# Patient Record
Sex: Male | Born: 1995 | Race: White | Hispanic: No | Marital: Single
Health system: Southern US, Community
[De-identification: ages and names within clinical notes are randomized; demographics above are authoritative.]

## PROBLEM LIST (undated history)

## (undated) DIAGNOSIS — F1910 Other psychoactive substance abuse, uncomplicated: Secondary | ICD-10-CM

---

## 2014-04-05 ENCOUNTER — Inpatient Hospital Stay (HOSPITAL_COMMUNITY)
Admission: EM | Admit: 2014-04-05 | Discharge: 2014-04-08 | DRG: 917 | Disposition: A | Discharge status: Other Acute Inpatient Hospital | Payer: BC Managed Care – PPO | Attending: Internal Medicine | Admitting: Internal Medicine

## 2014-04-05 ENCOUNTER — Observation Stay (HOSPITAL_COMMUNITY): Payer: BC Managed Care – PPO

## 2014-04-05 ENCOUNTER — Encounter (HOSPITAL_COMMUNITY): Payer: Self-pay | Admitting: Emergency Medicine

## 2014-04-05 DIAGNOSIS — F32A Depression, unspecified: Secondary | ICD-10-CM | POA: Diagnosis present

## 2014-04-05 DIAGNOSIS — J690 Pneumonitis due to inhalation of food and vomit: Secondary | ICD-10-CM

## 2014-04-05 DIAGNOSIS — E860 Dehydration: Secondary | ICD-10-CM | POA: Diagnosis present

## 2014-04-05 DIAGNOSIS — F1210 Cannabis abuse, uncomplicated: Secondary | ICD-10-CM | POA: Diagnosis present

## 2014-04-05 DIAGNOSIS — T424X4A Poisoning by benzodiazepines, undetermined, initial encounter: Principal | ICD-10-CM | POA: Diagnosis present

## 2014-04-05 DIAGNOSIS — T50901A Poisoning by unspecified drugs, medicaments and biological substances, accidental (unintentional), initial encounter: Secondary | ICD-10-CM

## 2014-04-05 DIAGNOSIS — Z818 Family history of other mental and behavioral disorders: Secondary | ICD-10-CM

## 2014-04-05 DIAGNOSIS — Z6836 Body mass index (BMI) 36.0-36.9, adult: Secondary | ICD-10-CM

## 2014-04-05 DIAGNOSIS — R000 Tachycardia, unspecified: Secondary | ICD-10-CM | POA: Diagnosis present

## 2014-04-05 DIAGNOSIS — G9340 Encephalopathy, unspecified: Secondary | ICD-10-CM

## 2014-04-05 DIAGNOSIS — F332 Major depressive disorder, recurrent severe without psychotic features: Secondary | ICD-10-CM | POA: Diagnosis present

## 2014-04-05 DIAGNOSIS — F1920 Other psychoactive substance dependence, uncomplicated: Secondary | ICD-10-CM | POA: Diagnosis present

## 2014-04-05 DIAGNOSIS — T438X2A Poisoning by other psychotropic drugs, intentional self-harm, initial encounter: Secondary | ICD-10-CM | POA: Diagnosis present

## 2014-04-05 DIAGNOSIS — E669 Obesity, unspecified: Secondary | ICD-10-CM | POA: Diagnosis present

## 2014-04-05 DIAGNOSIS — F17200 Nicotine dependence, unspecified, uncomplicated: Secondary | ICD-10-CM | POA: Diagnosis present

## 2014-04-05 DIAGNOSIS — M6282 Rhabdomyolysis: Secondary | ICD-10-CM

## 2014-04-05 DIAGNOSIS — D72829 Elevated white blood cell count, unspecified: Secondary | ICD-10-CM

## 2014-04-05 DIAGNOSIS — T43502A Poisoning by unspecified antipsychotics and neuroleptics, intentional self-harm, initial encounter: Secondary | ICD-10-CM | POA: Diagnosis present

## 2014-04-05 DIAGNOSIS — F329 Major depressive disorder, single episode, unspecified: Secondary | ICD-10-CM

## 2014-04-05 DIAGNOSIS — I498 Other specified cardiac arrhythmias: Secondary | ICD-10-CM

## 2014-04-05 DIAGNOSIS — F1910 Other psychoactive substance abuse, uncomplicated: Secondary | ICD-10-CM

## 2014-04-05 HISTORY — DX: Other psychoactive substance abuse, uncomplicated: F19.10

## 2014-04-05 LAB — CBC
HEMATOCRIT: 45.8 % (ref 39.0–52.0)
HEMOGLOBIN: 15.9 g/dL (ref 13.0–17.0)
MCH: 28.6 pg (ref 26.0–34.0)
MCHC: 34.7 g/dL (ref 30.0–36.0)
MCV: 82.5 fL (ref 78.0–100.0)
Platelets: 365 10*3/uL (ref 150–400)
RBC: 5.55 MIL/uL (ref 4.22–5.81)
RDW: 13.3 % (ref 11.5–15.5)
WBC: 20.3 10*3/uL — AB (ref 4.0–10.5)

## 2014-04-05 LAB — URINALYSIS, ROUTINE W REFLEX MICROSCOPIC
Bilirubin Urine: NEGATIVE
GLUCOSE, UA: NEGATIVE mg/dL
HGB URINE DIPSTICK: NEGATIVE
KETONES UR: 15 mg/dL — AB
Leukocytes, UA: NEGATIVE
Nitrite: NEGATIVE
Protein, ur: NEGATIVE mg/dL
Specific Gravity, Urine: 1.023 (ref 1.005–1.030)
UROBILINOGEN UA: 0.2 mg/dL (ref 0.0–1.0)
pH: 6 (ref 5.0–8.0)

## 2014-04-05 LAB — COMPREHENSIVE METABOLIC PANEL
ALBUMIN: 4.6 g/dL (ref 3.5–5.2)
ALK PHOS: 145 U/L — AB (ref 39–117)
ALT: 25 U/L (ref 0–53)
AST: 46 U/L — ABNORMAL HIGH (ref 0–37)
BILIRUBIN TOTAL: 1 mg/dL (ref 0.3–1.2)
BUN: 15 mg/dL (ref 6–23)
CHLORIDE: 96 meq/L (ref 96–112)
CO2: 25 mEq/L (ref 19–32)
Calcium: 10.2 mg/dL (ref 8.4–10.5)
Creatinine, Ser: 0.87 mg/dL (ref 0.50–1.35)
GFR calc non Af Amer: >90 mL/min (ref >90)
GLUCOSE: 98 mg/dL (ref 70–99)
POTASSIUM: 4.4 meq/L (ref 3.7–5.3)
SODIUM: 138 meq/L (ref 137–147)
Total Protein: 8.5 g/dL — ABNORMAL HIGH (ref 6.0–8.3)

## 2014-04-05 LAB — CK: Total CK: 4079 U/L — ABNORMAL HIGH (ref 7–232)

## 2014-04-05 LAB — RAPID URINE DRUG SCREEN, HOSP PERFORMED
Amphetamines: NOT DETECTED
BENZODIAZEPINES: POSITIVE — AB
Barbiturates: NOT DETECTED
COCAINE: NOT DETECTED
Opiates: POSITIVE — AB
TETRAHYDROCANNABINOL: POSITIVE — AB

## 2014-04-05 LAB — SALICYLATE LEVEL: Salicylate Lvl: <2 mg/dL — ABNORMAL LOW (ref 2.8–20.0)

## 2014-04-05 LAB — AMMONIA

## 2014-04-05 LAB — ETHANOL: Alcohol, Ethyl (B): <11 mg/dL (ref 0–11)

## 2014-04-05 LAB — MAGNESIUM: MAGNESIUM: 1.8 mg/dL (ref 1.5–2.5)

## 2014-04-05 LAB — TSH: TSH: 1.12 u[IU]/mL (ref 0.350–4.500)

## 2014-04-05 LAB — ACETAMINOPHEN LEVEL: Acetaminophen (Tylenol), Serum: <15 ug/mL (ref 10–30)

## 2014-04-05 MED ORDER — SODIUM CHLORIDE 0.9 % IV BOLUS (SEPSIS)
1000.0000 mL | Freq: Once | INTRAVENOUS | Status: AC
  Administered 2014-04-05: 1000 mL via INTRAVENOUS

## 2014-04-05 MED ORDER — POLYETHYLENE GLYCOL 3350 17 G PO PACK
17.0000 g | PACK | Freq: Every day | ORAL | Status: DC | PRN
  Filled 2014-04-05: qty 1

## 2014-04-05 MED ORDER — ALBUTEROL SULFATE (2.5 MG/3ML) 0.083% IN NEBU: 2.5000 mg | INHALATION_SOLUTION | RESPIRATORY_TRACT | Status: DC | PRN

## 2014-04-05 MED ORDER — ONDANSETRON HCL 4 MG PO TABS: 4.0000 mg | ORAL_TABLET | Freq: Four times a day (QID) | ORAL | Status: DC | PRN

## 2014-04-05 MED ORDER — SORBITOL 70 % SOLN
30.0000 mL | Freq: Every day | Status: DC | PRN
  Filled 2014-04-05: qty 30

## 2014-04-05 MED ORDER — NICOTINE 14 MG/24HR TD PT24
14.0000 mg | MEDICATED_PATCH | Freq: Every day | TRANSDERMAL | Status: DC
  Administered 2014-04-05 – 2014-04-08 (×4): 14 mg via TRANSDERMAL
  Filled 2014-04-05 (×4): qty 1

## 2014-04-05 MED ORDER — ENOXAPARIN SODIUM 60 MG/0.6ML ~~LOC~~ SOLN
60.0000 mg | SUBCUTANEOUS | Status: DC
  Administered 2014-04-05 – 2014-04-07 (×3): 60 mg via SUBCUTANEOUS
  Filled 2014-04-05 (×4): qty 0.6

## 2014-04-05 MED ORDER — PANTOPRAZOLE SODIUM 40 MG PO TBEC
40.0000 mg | DELAYED_RELEASE_TABLET | Freq: Every day | ORAL | Status: DC
  Administered 2014-04-05 – 2014-04-08 (×4): 40 mg via ORAL
  Filled 2014-04-05 (×4): qty 1

## 2014-04-05 MED ORDER — HYDROCODONE-ACETAMINOPHEN 5-325 MG PO TABS
1.0000 | ORAL_TABLET | ORAL | Status: DC | PRN
  Administered 2014-04-05 – 2014-04-06 (×3): 2 via ORAL
  Filled 2014-04-05 (×3): qty 2

## 2014-04-05 MED ORDER — MAGNESIUM CITRATE PO SOLN: 1.0000 | Freq: Once | ORAL | Status: AC | PRN

## 2014-04-05 MED ORDER — SODIUM CHLORIDE 0.9 % IJ SOLN
3.0000 mL | Freq: Two times a day (BID) | INTRAMUSCULAR | Status: DC
  Administered 2014-04-06 (×2): 3 mL via INTRAVENOUS

## 2014-04-05 MED ORDER — SODIUM CHLORIDE 0.9 % IV SOLN
INTRAVENOUS | Status: DC
  Administered 2014-04-05: 22:00:00 via INTRAVENOUS

## 2014-04-05 MED ORDER — ONDANSETRON HCL 4 MG/2ML IJ SOLN: 4.0000 mg | Freq: Four times a day (QID) | INTRAMUSCULAR | Status: DC | PRN

## 2014-04-05 MED ORDER — ACETAMINOPHEN 325 MG PO TABS
650.0000 mg | ORAL_TABLET | Freq: Four times a day (QID) | ORAL | Status: DC | PRN
  Administered 2014-04-07 – 2014-04-08 (×2): 650 mg via ORAL
  Filled 2014-04-05 (×2): qty 2

## 2014-04-05 MED ORDER — ACETAMINOPHEN 650 MG RE SUPP: 650.0000 mg | Freq: Four times a day (QID) | RECTAL | Status: DC | PRN

## 2014-04-05 NOTE — ED Provider Notes (Signed)
CSN: [id number]     Arrival date & time 04/05/14  1550 History   First MD Initiated Contact with Patient 04/05/14 1553     Chief Complaint  Patient presents with  . Medical Clearance     (Consider location/radiation/quality/duration/timing/severity/associated sxs/prior Treatment) HPI  Patient to the ER by [employer] for IVC papers taken out by the patients father.   The patient reports that he has been unable to sleep recently and took 5 x 2mg  tablets of Xanax before bed. He says his dad saw him "all sleepy and I think that was the last straw" He denies doing it to kill or harm himself. He admits it was the most he has ever taken. HE reports some problems at work and that he smokes weed. He reports that in his earlier teen years he had some substance abuse issues as well as thoughts of harm but not anymore. His heart rate is in the 150's pt denies feeling abnormal but admits he is very frustrated right now. At the time he is cooperative and coherent. Denies auditory or visual hallucinations. Denies Si/HI  The father reports that he was mumbling and unintelligible. [employer] say he was "passed out beside a building".  He has a history of ETOH and drug abuse in the past 6 months and was recently fired from his job "for sexual harassment".  History reviewed. No pertinent past medical history. History reviewed. No pertinent past surgical history. History reviewed. No pertinent family history. History  Substance Use Topics  . Smoking status: Current Some Day Smoker  . Smokeless tobacco: Not on file  . Alcohol Use: Yes    Review of Systems   Review of Systems  Gen: no weight loss, fevers, chills, night sweats  Eyes: no discharge or drainage, no occular pain or visual changes  Nose: no epistaxis or rhinorrhea  Mouth: no dental pain, no sore throat  Neck: no neck pain  Lungs:No wheezing, coughing or hemoptysis CV: no chest pain, palpitations, dependent edema or orthopnea  Abd: no abdominal pain,  nausea, vomiting, diarrhea GU: no dysuria or gross hematuria  MSK:  No muscle weakness or pain Neuro: no headache, no focal neurologic deficits  +sleepy and intentional overdose Skin: no rash or wounds Psyche: no complaints    Allergies  Review of patient's allergies indicates no known allergies.  Home Medications   Prior to Admission medications   Not on File   BP 133/71  Pulse 117  Temp(Src) 98.9 F (37.2 C) (Oral)  Resp 39  SpO2 98% Physical Exam  Nursing note and vitals reviewed. Constitutional: He appears well-developed and well-nourished. No distress.  HENT:  Head: Normocephalic and atraumatic.  Eyes: Pupils are equal, round, and reactive to light.  Neck: Normal range of motion. Neck supple.  Cardiovascular: Regular rhythm.  Tachycardia present.   Pulmonary/Chest: Effort normal.  Abdominal: Soft.  Neurological: He is alert.  Skin: Skin is warm and dry.  Psychiatric: His mood appears anxious. He is not actively hallucinating. He exhibits a depressed mood. He expresses no homicidal and no suicidal ideation. He expresses no suicidal plans and no homicidal plans. He is attentive.    ED Course  Procedures (including critical care time) Labs Review Labs Reviewed  CBC - Abnormal; Notable for the following:    WBC 20.3 (*)    All other components within normal limits  COMPREHENSIVE METABOLIC PANEL - Abnormal; Notable for the following:    Total Protein 8.5 (*)    AST 46 (*)  Alkaline Phosphatase 145 (*)    All other components within normal limits  SALICYLATE LEVEL - Abnormal; Notable for the following:    Salicylate Lvl <2.0 (*)    All other components within normal limits  URINE RAPID DRUG SCREEN (HOSP PERFORMED) - Abnormal; Notable for the following:    Opiates POSITIVE (*)    Benzodiazepines POSITIVE (*)    Tetrahydrocannabinol POSITIVE (*)    All other components within normal limits  CK - Abnormal; Notable for the following:    Total CK 4079 (*)     All other components within normal limits  ACETAMINOPHEN LEVEL  ETHANOL  AMMONIA    Imaging Review No results found.   EKG Interpretation   Date/Time:  Friday April 05 2014 15:59:02 EDT Ventricular Rate:  153 PR Interval:  98 QRS Duration: 87 QT Interval:  266 QTC Calculation: 424 R Axis:   66 Text Interpretation:  Sinus tachycardia Multiple ventricular premature  complexes No prior Confirmed by Gwendolyn GrantWALDEN  MD, BLAIR ([id number]) on 04/05/2014  4:23:25 PM      MDM   Final diagnoses:  Rhabdomyolysis  Drug overdose  Polysubstance abuse    Patients labs are abnormal. The CK is 4079. The patient apparently was immobilized for a prolonged period of time fafter taking too many xanax and other substances (parents feel it is heroin).  Will need a medical admission. At this time his pulse is improving with fluids but he continues to be tachypnic. Will admit to medicine. Dr. Gwendolyn GrantWalden has seen patient as well and is aware of plan.  Inpatient, tele, [facility], [facility], [facility].    Dorthula Matasiffany G Svetlana Bagby, PA-C 04/05/14 1800

## 2014-04-05 NOTE — Progress Notes (Signed)
Utilization Review completed.  Shan Padgett RN CM  

## 2014-04-05 NOTE — ED Notes (Signed)
Bed: [facility] Expected date:  Expected time:  Means of arrival:  Comments: [employer] med clearance

## 2014-04-05 NOTE — H&P (Signed)
[facility] History and Physical  Antonio Sullivan ZOX:[id number] DOB: March 13, 1996 DOA: 04/05/2014  Referring physician: Dr. Gwendolyn Grant PCP: No primary provider on file.   Chief Complaint: Altered mental status/acute encephalopathy  HPI: Antonio Sullivan is a 18 y.o. male  With history of polysubstance abuse with use of heroin states last use was 2-3 weeks prior to admission, history of abuse to anxiolytics, tobacco history presents to the ED from home as IVC which was taken on by his parents after being found down on the kitchen floor. Patient's mother states he found his son passed out and down on the kitchen floor around 4:45 AM on the morning of admission with increased respiratory rate. Stated that once patient when he woke up his mother went downtown and took out IVC papers on the patient as she felt patient needed help with his polysubstance abuse. Patient was subsequently brought to the ED. Per ED physicians report the police and stated that patient was mumbling unintelligible words. Patient denies any suicidal ideation. Patient denies any homicidal ideation. Patient states he took 5   2 mg Xanax tablets in order to be able to get some sleep and did not have any sleep 2-3 days prior to admission. Patient does endorse a depressed mood which started after his grandfather passed away on 08/31/2013. Patient stated that his school grades suffered, he felt very hopeless. Patient also does endorse some agitation. Patient states he drinks about one 40 ounce per week. Patient denies any auditory or visual hallucinations. Patient was seen in the emergency room was noted to be tachycardic with heart rates in the 150s that responded to IV fluids. Blood pressure was stable. CBC done had a white count of 20.3 otherwise was within normal limits. EKG done showed a sinus tachycardia. Comprehensive metabolic profile done on phosphatase of 145 AST of 46 protein of 8.5 otherwise was within normal limits. Total CK was  4079. Salicylate level was less than 2. Tylenol level is less than 15. Urine drug screen which was done was positive for benzos and opiates and THC. Urinalysis was not done. Chest x-ray was not done. We were called to admit the patient for further evaluation and management.   Review of Systems: As per history of present illness otherwise negative. Constitutional:  No weight loss, night sweats, Fevers, chills, fatigue.  HEENT:  No headaches, Difficulty swallowing,Tooth/dental problems,Sore throat,  No sneezing, itching, ear ache, nasal congestion, post nasal drip,  Cardio-vascular:  No chest pain, Orthopnea, PND, swelling in lower extremities, anasarca, dizziness, palpitations  GI:  No heartburn, indigestion, abdominal pain, nausea, vomiting, diarrhea, change in bowel habits, loss of appetite  Resp:  No shortness of breath with exertion or at rest. No excess mucus, no productive cough, No non-productive cough, No coughing up of blood.No change in color of mucus.No wheezing.No chest wall deformity  Skin:  no rash or lesions.  GU:  no dysuria, change in color of urine, no urgency or frequency. No flank pain.  Musculoskeletal:  No joint pain or swelling. No decreased range of motion. No back pain.  Psych:  No change in mood or affect. No depression or anxiety. No memory loss.   Past Medical History  Diagnosis Date  . Polysubstance abuse 04/05/2014   History reviewed. No pertinent past surgical history. Social History:  reports that he has been smoking.  He does not have any smokeless tobacco history on file. He reports that he drinks alcohol. He reports that he uses illicit drugs (Marijuana).  No  Known Allergies  History reviewed. No pertinent family history.   Prior to Admission medications   Not on File   Physical Exam: Filed Vitals:   04/05/14 1730  BP: 147/88  Pulse: 116  Temp:   Resp: 40    BP 147/88  Pulse 116  Temp(Src) 98.9 F (37.2 C) (Oral)  Resp 40  SpO2  98%  General:  Appears calm and comfortable. Patient in no acute cardiopulmonary distress. Eyes: PERRLA, normal lids, irises & conjunctiva ENT: grossly normal hearing, lips & tongue Neck: no LAD, masses or thyromegaly Cardiovascular: Tachycardic, no m/r/g. No LE edema. Telemetry: Sinus tachycardia Respiratory: CTA bilaterally, no w/r/r. Normal respiratory effort. Abdomen: soft, ntnd, positive bowel sounds. No rebound, no guarding. Skin: no rash or induration seen on limited exam Musculoskeletal: grossly normal tone BUE/BLE Psychiatric: Patient with a depressed mood. Poor insight. Poor judgment.  Neurologic: Alert and oriented x3. Cranial nerves II through XII are grossly intact. No focal deficits.           Labs on Admission:  Basic Metabolic Panel:  Recent Labs Lab 04/05/14 1600  NA 138  K 4.4  CL 96  CO2 25  GLUCOSE 98  BUN 15  CREATININE 0.87  CALCIUM 10.2   Liver Function Tests:  Recent Labs Lab 04/05/14 1600  AST 46*  ALT 25  ALKPHOS 145*  BILITOT 1.0  PROT 8.5*  ALBUMIN 4.6   No results found for this basename: LIPASE, AMYLASE,  in the last 168 hours  Recent Labs Lab 04/05/14 1640  AMMONIA RESULTS UNAVAILABLE DUE TO INTERFERING SUBSTANCE   CBC:  Recent Labs Lab 04/05/14 1600  WBC 20.3*  HGB 15.9  HCT 45.8  MCV 82.5  PLT 365   Cardiac Enzymes:  Recent Labs Lab 04/05/14 1600  CKTOTAL 4079*    BNP (last 3 results) No results found for this basename: PROBNP,  in the last 8760 hours CBG: No results found for this basename: GLUCAP,  in the last 168 hours  Radiological Exams on Admission: No results found.  EKG: Independently reviewed. Sinus tachycardia  Assessment/Plan Principal Problem:   Acute encephalopathy Active Problems:   Rhabdomyolysis   Sinus tachycardia   Leukocytosis   Depression   Polysubstance abuse  #1 acute encephalopathy/?? Drug overdose Questionable etiology. Likely secondary to multiple Xanax tablets patient  took. Patient denies any suicidal ideation. Patient denies any homicidal ideation. Patient is currently alert and following commands but no neurological deficits. CBC does have a leukocytosis with a white count of 20.3. Will check a urinalysis. Check a chest x-ray. Patient does have a history of polysubstance abuse. Will consult with psychiatry for further evaluation of his polysubstance abuse with possible drug overdose.  #2 mild rhabdomyolysis Patient has a normal renal function. Elevated CK likely secondary to patient's increased body mass and also possibly laying on the floor. Patient denies any muscular pain. Will hydrate with IV fluids. We'll follow renal function. I will follow total CK levels.  #3 depression/polysubstance abuse Patient did state that he felt depressed after his grandfather died in August of 2014 with a sense of hopelessness at that time. Patient also with a history of polysubstance abuse including heroin and anxiolytics. Patient does state that there is a family history of depression as well. Patient currently denies any suicidal ideation. No homicidal ideation. IVC papers have been taken out per patient's family. Will monitor patient on telemetry floor. We'll consult with psychiatry for further evaluation and management.  #4 leukocytosis Likely reactive  leukocytosis. Patient with no respiratory symptoms. Will check a chest x-ray. Check a UA with cultures and sensitivities. Follow.  #5 sinus tachycardia Likely secondary to dehydration. Also possibly secondary to anxiety. Heart rate improved with hydration. Place on IV fluids. Follow.  #6 prophylaxis PPI for GI prophylaxis. Lovenox for DVT prophylaxis.  Code Status: Full Family Communication: Updated patient, father and grandmother at bedside. Disposition Plan: Admit to telemetry.  Time spent: 65 minutes  Rodolph Bonganiel V Thompson MD [facility] Pager [phone]

## 2014-04-05 NOTE — ED Notes (Signed)
Patient transported to X-ray 

## 2014-04-05 NOTE — ED Notes (Signed)
[facility] state that 'pt was in a coherent state this morning and was mumbling in an unintelligible manner'.  Hx of drug and ETOH abuse in last 6 months. Recently fired for a job. Pt denies there is a problem. At present pt alert and oriented x4. Reports marijuana use daily and used xanax last night. Denies ETOH. Denies pain. Reports this morning he slept in. Denies SI/HI.

## 2014-04-05 NOTE — ED Provider Notes (Signed)
Medical screening examination/treatment/procedure(s) were conducted as a shared visit with non-physician practitioner(s) and myself.  I personally evaluated the patient during the encounter.   EKG Interpretation   Date/Time:  Friday April 05 2014 15:59:02 EDT Ventricular Rate:  153 PR Interval:  98 QRS Duration: 87 QT Interval:  266 QTC Calculation: 424 R Axis:   66 Text Interpretation:  Sinus tachycardia Multiple ventricular premature  complexes No prior Confirmed by Gwendolyn GrantWALDEN  MD, Orine Goga ([id number]) on 04/05/2014  4:23:25 PM      Patient IVC'd by parents since he was found down, hx of drug use, SI, lying to parents - admitted to taking Xanax to help him sleep. No SI prior to coming, but states SI now that he was "dragged to the ER." Patient with tachycardia in the 150s on EKG. With recent Xanax use, doubt withdrawal. Since found down, concern for dehydration. CK elevated at 4079, c/w mild rhabdomyolysis. HR responding for fluids, improving to 120s on my initial exam, sinus tachycardia on the monitor. Admitted for observation by Dr. Janee Mornhompson.   Dagmar HaitWilliam Cj Beecher, MD 04/05/14 [phone]

## 2014-04-06 DIAGNOSIS — T50901A Poisoning by unspecified drugs, medicaments and biological substances, accidental (unintentional), initial encounter: Secondary | ICD-10-CM

## 2014-04-06 DIAGNOSIS — F1910 Other psychoactive substance abuse, uncomplicated: Secondary | ICD-10-CM

## 2014-04-06 DIAGNOSIS — J690 Pneumonitis due to inhalation of food and vomit: Secondary | ICD-10-CM | POA: Diagnosis present

## 2014-04-06 DIAGNOSIS — F332 Major depressive disorder, recurrent severe without psychotic features: Secondary | ICD-10-CM

## 2014-04-06 LAB — CBC WITH DIFFERENTIAL/PLATELET
BASOS PCT: 0 % (ref 0–1)
Basophils Absolute: 0 10*3/uL (ref 0.0–0.1)
Eosinophils Absolute: 0.1 10*3/uL (ref 0.0–0.7)
Eosinophils Relative: 1 % (ref 0–5)
HEMATOCRIT: 36.6 % — AB (ref 39.0–52.0)
HEMOGLOBIN: 13.3 g/dL (ref 13.0–17.0)
Lymphocytes Relative: 29 % (ref 12–46)
Lymphs Abs: 3.9 10*3/uL (ref 0.7–4.0)
MCH: 30.6 pg (ref 26.0–34.0)
MCHC: 36.3 g/dL — AB (ref 30.0–36.0)
MCV: 84.3 fL (ref 78.0–100.0)
Monocytes Absolute: 1.4 10*3/uL — ABNORMAL HIGH (ref 0.1–1.0)
Monocytes Relative: 10 % (ref 3–12)
NEUTROS ABS: 7.9 10*3/uL — AB (ref 1.7–7.7)
NEUTROS PCT: 59 % (ref 43–77)
PLATELETS: 234 10*3/uL (ref 150–400)
RBC: 4.34 MIL/uL (ref 4.22–5.81)
RDW: 13.6 % (ref 11.5–15.5)
WBC: 13.3 10*3/uL — ABNORMAL HIGH (ref 4.0–10.5)

## 2014-04-06 LAB — URINE CULTURE: Colony Count: 2000

## 2014-04-06 LAB — COMPREHENSIVE METABOLIC PANEL
ALBUMIN: 3.4 g/dL — AB (ref 3.5–5.2)
ALT: 20 U/L (ref 0–53)
AST: 51 U/L — AB (ref 0–37)
Alkaline Phosphatase: 106 U/L (ref 39–117)
BUN: 16 mg/dL (ref 6–23)
CALCIUM: 9.1 mg/dL (ref 8.4–10.5)
CO2: 24 mEq/L (ref 19–32)
CREATININE: 0.9 mg/dL (ref 0.50–1.35)
Chloride: 106 mEq/L (ref 96–112)
GFR calc Af Amer: >90 mL/min (ref >90)
GFR calc non Af Amer: >90 mL/min (ref >90)
Glucose, Bld: 96 mg/dL (ref 70–99)
Potassium: 3.9 mEq/L (ref 3.7–5.3)
Sodium: 141 mEq/L (ref 137–147)
TOTAL PROTEIN: 6.5 g/dL (ref 6.0–8.3)
Total Bilirubin: 0.5 mg/dL (ref 0.3–1.2)

## 2014-04-06 LAB — CK TOTAL AND CKMB (NOT AT ARMC)
CK TOTAL: 3397 U/L — AB (ref 7–232)
CK, MB: 11.6 ng/mL — AB (ref 0.3–4.0)
Relative Index: 0.3 (ref 0.0–2.5)

## 2014-04-06 MED ORDER — LEVOFLOXACIN 500 MG PO TABS
500.0000 mg | ORAL_TABLET | Freq: Every day | ORAL | Status: DC
  Administered 2014-04-06 – 2014-04-08 (×3): 500 mg via ORAL
  Filled 2014-04-06 (×3): qty 1

## 2014-04-06 MED ORDER — LEVOFLOXACIN 500 MG PO TABS: 500.0000 mg | ORAL_TABLET | Freq: Every day | ORAL | Status: DC

## 2014-04-06 MED ORDER — NICOTINE 14 MG/24HR TD PT24: 14.0000 mg | MEDICATED_PATCH | Freq: Every day | TRANSDERMAL | Status: DC

## 2014-04-06 NOTE — Discharge Summary (Signed)
Physician Discharge Summary  Antonio Sullivan ZOX:[id number] DOB: 1995-12-26 DOA: 04/05/2014  PCP: No primary provider on file.  Admit date: 04/05/2014 Discharge date: 04/06/2014  Time spent: 40 minutes  Recommendations for Outpatient Follow-up:  1. Drink plenty of fluids for mild rhabdomyalysis 2. Psychiatric treatment for Polysubstance abuse  Discharge Diagnoses:  Principal Problem:   Acute encephalopathy Active Problems:   Rhabdomyolysis   Sinus tachycardia   Leukocytosis   Depression   Polysubstance abuse   Aspiration pneumonia   Discharge Condition: stable  Diet recommendation:regular  Filed Weights   04/05/14 1905  Weight: 127.6 kg (281 lb 4.9 oz)    History of present illness:  Antonio Corral is a 18 y.o. male  With history of polysubstance abuse with use of heroin states last use was 2-3 weeks prior to admission, history of abuse to anxiolytics, tobacco history presents to the ED from home as IVC which was taken on by his parents after being found down on the kitchen floor.  Patient's mother states he found his son passed out and down on the kitchen floor around 4:45 AM on the morning of admission with increased respiratory rate. Stated that once patient when he woke up his mother went downtown and took out IVC papers on the patient as she felt patient needed help with his polysubstance abuse.  Patient was subsequently brought to the ED. Per ED physicians report the police and stated that patient was mumbling unintelligible words. Patient denies any suicidal ideation. Patient denies any homicidal ideation. Patient states he took 5 2 mg Xanax tablets in order to be able to get some sleep and did not have any sleep 2-3 days prior to admission. Patient does endorse a depressed mood which started after his grandfather passed away on 08/11/2013. Patient stated that his school grades suffered, he felt very hopeless. Patient also does endorse some agitation. Patient states he drinks  about one 40 ounce per week. Patient denies any auditory or visual hallucinations.  Patient was seen in the emergency room was noted to be tachycardic with heart rates in the 150s that responded to IV fluids. Blood pressure was stable. CBC done had a white count of 20.3 otherwise was within normal limits. EKG done showed a sinus tachycardia. Comprehensive metabolic profile done on phosphatase of 145 AST of 46 protein of 8.5 otherwise was within normal limits. Total CK was 4079  Hospital Course:  #1.Drug overdose  Ingested multipleXanax tablets  -denies any suicidal ideation/homicidal ideation.  -improved mentation  -Psych eval completed, inpatient PSychiatry recommended  #2 mild rhabdomyolysis  -Found down due to OD  -improving  -encourage plenty of fluids PO  #3 depression/polysubstance abuse  history of polysubstance abuse including heroin and anxiolytics  -denies any suicidal ideation. No homicidal ideation. IVC papers have been taken out per patient's family  -psych and CSW consult completed -Inpatient Psych recommended  #4 Aspiration pneumonia  -asymptomatic, noted on CXR -will treat with levaquin for 7 days  -WBC improving   #5 sinus tachycardia  Likely secondary to pneumonia, OD  -resolved    Consultations:  Psych  Discharge Exam: Filed Vitals:   04/06/14 1427  BP: 147/65  Pulse: 82  Temp: 97.5 F (36.4 C)  Resp: 20    General: AAOx3 Cardiovascular: S1S2/RRR Respiratory: CTAB  Discharge Instructions You were cared for by a hospitalist during your hospital stay. If you have any questions about your discharge medications or the care you received while you were in the hospital after you are  discharged, you can call the unit and asked to speak with the hospitalist on call if the hospitalist that took care of you is not available. Once you are discharged, your primary care physician will handle any further medical issues. Please note that NO REFILLS for any  discharge medications will be authorized once you are discharged, as it is imperative that you return to your primary care physician (or establish a relationship with a primary care physician if you do not have one) for your aftercare needs so that they can reassess your need for medications and monitor your lab values.     Medication List    Notice   You have not been prescribed any medications.     No Known Allergies    The results of significant diagnostics from this hospitalization (including imaging, microbiology, ancillary and laboratory) are listed below for reference.    Significant Diagnostic Studies: Dg Chest 2 View  04/05/2014   CLINICAL DATA:  Altered mental status.  Leukocytosis.  EXAM: CHEST  2 VIEW  COMPARISON:  None.  FINDINGS: There is right upper and right lower lobe airspace disease most consistent with pneumonia. The left lung is clear. Heart size is normal. No pneumothorax or pleural effusion.  IMPRESSION: Right upper and lower lobe airspace disease most consistent with pneumonia.   Electronically Signed   By: Drusilla Kannerhomas  Dalessio M.D.   On: 04/05/2014 18:54    Microbiology: No results found for this or any previous visit (from the past 240 hour(s)).   Labs: Basic Metabolic Panel:  Recent Labs Lab 04/05/14 1600 04/06/14 0430  NA 138 141  K 4.4 3.9  CL 96 106  CO2 25 24  GLUCOSE 98 96  BUN 15 16  CREATININE 0.87 0.90  CALCIUM 10.2 9.1  MG 1.8  --    Liver Function Tests:  Recent Labs Lab 04/05/14 1600 04/06/14 0430  AST 46* 51*  ALT 25 20  ALKPHOS 145* 106  BILITOT 1.0 0.5  PROT 8.5* 6.5  ALBUMIN 4.6 3.4*   No results found for this basename: LIPASE, AMYLASE,  in the last 168 hours  Recent Labs Lab 04/05/14 1640  AMMONIA RESULTS UNAVAILABLE DUE TO INTERFERING SUBSTANCE   CBC:  Recent Labs Lab 04/05/14 1600 04/06/14 0430  WBC 20.3* 13.3*  NEUTROABS  --  7.9*  HGB 15.9 13.3  HCT 45.8 36.6*  MCV 82.5 84.3  PLT 365 234   Cardiac  Enzymes:  Recent Labs Lab 04/05/14 1600 04/06/14 0430  CKTOTAL 4079* 3397*  CKMB  --  11.6*   BNP: BNP (last 3 results) No results found for this basename: PROBNP,  in the last 8760 hours CBG: No results found for this basename: GLUCAP,  in the last 168 hours     Signed:  Zannie CovePreetha Xiao Graul  [facility] 04/06/2014, 2:55 PM

## 2014-04-06 NOTE — Consult Note (Signed)
[facility] Face-to-Face Psychiatry Consult   Reason for Consult:  Polysubstance dependence, depression Referring Physician:  Dr Grandville Silos  Antonio Sullivan is an 18 y.o. male. Total Time spent with patient: 20 minutes  Assessment: AXIS I:  Major Depression, Recurrent severe and Substance Abuse AXIS II:  Deferred AXIS III:   Past Medical History  Diagnosis Date  . Polysubstance abuse 04/05/2014   AXIS IV:  housing problems, other psychosocial or environmental problems, problems related to social environment and problems with primary support group AXIS V:  31-40 impairment in reality testing  Plan:  Recommend psychiatric Inpatient admission when medically cleared.  Subjective:   Antonio Reigle is a 18 y.o. male patient admitted with polysubstance dependence.  HPI:  Patient seen chart reviewed.  Patient is a 18 year old Caucasian unemployed single man who was admitted on the medical floor because of change in his mental status.  Psychiatry consult was called because patient is using drugs and alcohol.  Patient told that he has been using drugs and alcohol to help his depression.  He admitted history of severe depression most of his life however it has been getting worse and past few months.  Patient has noticed increased depression since his grandfather died in 08-06-2013.  He endorsed poor sleep, anhedonia, feelings of hopelessness and worthlessness.  He admitted to strong suicidal thoughts but denies any plan.  He has poor self-esteem regarding this self-image, obesity.  He endorsed limited socialization.  He admitted to poor sleep and lack of motivation.  He admitted to using Xanax from the street, marijuana and also pain medication.  He also endorsed using cocaine in the past.  He endorses suicidal thoughts and plan few months ago to take overdose but he had no nerves to do it.  Patient endorsed that his parents are separated and currently he is living with his father and sometimes stepfather.  Patient  appears tearful during the conversation.  He admitted heavy use of drugs because he does not want to go through this pain of depression.  His family recently took out IVC paper.  Patient denies any hallucination or any paranoia but endorsed withdrawn and very tearful.   Past Psychiatric History: Past Medical History  Diagnosis Date  . Polysubstance abuse 04/05/2014    reports that he has been smoking.  He does not have any smokeless tobacco history on file. He reports that he drinks alcohol. He reports that he uses illicit drugs (Marijuana). History reviewed. No pertinent family history.   Living Arrangements: Parent;Other relatives   Abuse/Neglect [facility]) Physical Abuse: Denies Verbal Abuse: Denies Sexual Abuse: Denies Allergies:  No Known Allergies  ACT Assessment Complete:  No:   Past Psychiatric History: Patient denies any previous history of psychiatric inpatient treatment or any suicidal attempt but endorsed long history of depression.  He is using heavy heavy drugs.  Objective: Blood pressure 147/65, pulse 82, temperature 97.5 F (36.4 C), temperature source Oral, resp. rate 20, height _0  (1.88 m), weight 281 lb 4.9 oz (127.6 kg), SpO2 98.00%.Body mass index is 36.1 kg/(m^2). Results for orders placed during the hospital encounter of 04/05/14 (from the past 72 hour(s))  ACETAMINOPHEN LEVEL     Status: None   Collection Time    04/05/14  4:00 PM      Result Value Ref Range   Acetaminophen (Tylenol), Serum <15.0  10 - 30 ug/mL   Comment:            THERAPEUTIC CONCENTRATIONS VARY  SIGNIFICANTLY. A RANGE OF 10-30     ug/mL MAY BE AN EFFECTIVE     CONCENTRATION FOR MANY PATIENTS.     HOWEVER, SOME ARE BEST TREATED     AT CONCENTRATIONS OUTSIDE THIS     RANGE.     ACETAMINOPHEN CONCENTRATIONS     >150 ug/mL AT 4 HOURS AFTER     INGESTION AND >50 ug/mL AT 12     HOURS AFTER INGESTION ARE     OFTEN ASSOCIATED WITH TOXIC     REACTIONS.  CBC     Status: Abnormal    Collection Time    04/05/14  4:00 PM      Result Value Ref Range   WBC 20.3 (*) 4.0 - 10.5 K/uL   RBC 5.55  4.22 - 5.81 MIL/uL   Hemoglobin 15.9  13.0 - 17.0 g/dL   HCT 45.8  39.0 - 52.0 %   MCV 82.5  78.0 - 100.0 fL   MCH 28.6  26.0 - 34.0 pg   MCHC 34.7  30.0 - 36.0 g/dL   RDW 13.3  11.5 - 15.5 %   Platelets 365  150 - 400 K/uL  COMPREHENSIVE METABOLIC PANEL     Status: Abnormal   Collection Time    04/05/14  4:00 PM      Result Value Ref Range   Sodium 138  137 - 147 mEq/L   Potassium 4.4  3.7 - 5.3 mEq/L   Chloride 96  96 - 112 mEq/L   CO2 25  19 - 32 mEq/L   Glucose, Bld 98  70 - 99 mg/dL   BUN 15  6 - 23 mg/dL   Creatinine, Ser 0.87  0.50 - 1.35 mg/dL   Calcium 10.2  8.4 - 10.5 mg/dL   Total Protein 8.5 (*) 6.0 - 8.3 g/dL   Albumin 4.6  3.5 - 5.2 g/dL   AST 46 (*) 0 - 37 U/L   ALT 25  0 - 53 U/L   Alkaline Phosphatase 145 (*) 39 - 117 U/L   Total Bilirubin 1.0  0.3 - 1.2 mg/dL   GFR calc non Af Amer >90  >90 mL/min   GFR calc Af Amer >90  >90 mL/min   Comment: (NOTE)     The eGFR has been calculated using the CKD EPI equation.     This calculation has not been validated in all clinical situations.     eGFR's persistently <90 mL/min signify possible Chronic Kidney     Disease.  ETHANOL     Status: None   Collection Time    04/05/14  4:00 PM      Result Value Ref Range   Alcohol, Ethyl (B) <11  0 - 11 mg/dL   Comment:            LOWEST DETECTABLE LIMIT FOR     SERUM ALCOHOL IS 11 mg/dL     FOR MEDICAL PURPOSES ONLY  SALICYLATE LEVEL     Status: Abnormal   Collection Time    04/05/14  4:00 PM      Result Value Ref Range   Salicylate Lvl <3.2 (*) 2.8 - 20.0 mg/dL  CK     Status: Abnormal   Collection Time    04/05/14  4:00 PM      Result Value Ref Range   Total CK 4079 (*) 7 - 232 U/L  MAGNESIUM     Status: None   Collection Time    04/05/14  4:00 PM      Result Value Ref Range   Magnesium 1.8  1.5 - 2.5 mg/dL  URINE RAPID DRUG SCREEN (HOSP PERFORMED)      Status: Abnormal   Collection Time    04/05/14  4:23 PM      Result Value Ref Range   Opiates POSITIVE (*) NONE DETECTED   Cocaine NONE DETECTED  NONE DETECTED   Benzodiazepines POSITIVE (*) NONE DETECTED   Amphetamines NONE DETECTED  NONE DETECTED   Tetrahydrocannabinol POSITIVE (*) NONE DETECTED   Barbiturates NONE DETECTED  NONE DETECTED   Comment:            DRUG SCREEN FOR MEDICAL PURPOSES     ONLY.  IF CONFIRMATION IS NEEDED     FOR ANY PURPOSE, NOTIFY LAB     WITHIN 5 DAYS.                LOWEST DETECTABLE LIMITS     FOR URINE DRUG SCREEN     Drug Class       Cutoff (ng/mL)     Amphetamine      1000     Barbiturate      200     Benzodiazepine   981     Tricyclics       191     Opiates          300     Cocaine          300     THC              50  AMMONIA     Status: None   Collection Time    04/05/14  4:40 PM      Result Value Ref Range   Ammonia RESULTS UNAVAILABLE DUE TO INTERFERING SUBSTANCE  11 - 60 umol/L  TSH     Status: None   Collection Time    04/05/14  4:45 PM      Result Value Ref Range   TSH 1.120  0.350 - 4.500 uIU/mL   Comment: Please note change in reference range.     Performed at [place], [place] MICROSCOPIC     Status: Abnormal   Collection Time    04/05/14  6:52 PM      Result Value Ref Range   Color, Urine YELLOW  YELLOW   APPearance CLOUDY (*) CLEAR   Specific Gravity, Urine 1.023  1.005 - 1.030   pH 6.0  5.0 - 8.0   Glucose, UA NEGATIVE  NEGATIVE mg/dL   Hgb urine dipstick NEGATIVE  NEGATIVE   Bilirubin Urine NEGATIVE  NEGATIVE   Ketones, ur 15 (*) NEGATIVE mg/dL   Protein, ur NEGATIVE  NEGATIVE mg/dL   Urobilinogen, UA 0.2  0.0 - 1.0 mg/dL   Nitrite NEGATIVE  NEGATIVE   Leukocytes, UA NEGATIVE  NEGATIVE   Comment: MICROSCOPIC NOT DONE ON URINES WITH NEGATIVE PROTEIN, BLOOD, LEUKOCYTES, NITRITE, OR GLUCOSE <1000 mg/dL.  COMPREHENSIVE METABOLIC PANEL     Status: Abnormal   Collection Time     04/06/14  4:30 AM      Result Value Ref Range   Sodium 141  137 - 147 mEq/L   Potassium 3.9  3.7 - 5.3 mEq/L   Chloride 106  96 - 112 mEq/L   Comment: DELTA CHECK NOTED   CO2 24  19 - 32 mEq/L   Glucose, Bld 96  70 - 99 mg/dL  BUN 16  6 - 23 mg/dL   Creatinine, Ser 0.90  0.50 - 1.35 mg/dL   Calcium 9.1  8.4 - 10.5 mg/dL   Total Protein 6.5  6.0 - 8.3 g/dL   Albumin 3.4 (*) 3.5 - 5.2 g/dL   AST 51 (*) 0 - 37 U/L   ALT 20  0 - 53 U/L   Alkaline Phosphatase 106  39 - 117 U/L   Total Bilirubin 0.5  0.3 - 1.2 mg/dL   GFR calc non Af Amer >90  >90 mL/min   GFR calc Af Amer >90  >90 mL/min   Comment: (NOTE)     The eGFR has been calculated using the CKD EPI equation.     This calculation has not been validated in all clinical situations.     eGFR's persistently <90 mL/min signify possible Chronic Kidney     Disease.  CBC WITH DIFFERENTIAL     Status: Abnormal   Collection Time    04/06/14  4:30 AM      Result Value Ref Range   WBC 13.3 (*) 4.0 - 10.5 K/uL   RBC 4.34  4.22 - 5.81 MIL/uL   Hemoglobin 13.3  13.0 - 17.0 g/dL   HCT 36.6 (*) 39.0 - 52.0 %   MCV 84.3  78.0 - 100.0 fL   MCH 30.6  26.0 - 34.0 pg   MCHC 36.3 (*) 30.0 - 36.0 g/dL   RDW 13.6  11.5 - 15.5 %   Platelets 234  150 - 400 K/uL   Comment: DELTA CHECK NOTED     REPEATED TO VERIFY   Neutrophils Relative % 59  43 - 77 %   Neutro Abs 7.9 (*) 1.7 - 7.7 K/uL   Lymphocytes Relative 29  12 - 46 %   Lymphs Abs 3.9  0.7 - 4.0 K/uL   Monocytes Relative 10  3 - 12 %   Monocytes Absolute 1.4 (*) 0.1 - 1.0 K/uL   Eosinophils Relative 1  0 - 5 %   Eosinophils Absolute 0.1  0.0 - 0.7 K/uL   Basophils Relative 0  0 - 1 %   Basophils Absolute 0.0  0.0 - 0.1 K/uL  CK TOTAL AND CKMB     Status: Abnormal   Collection Time    04/06/14  4:30 AM      Result Value Ref Range   Total CK 3397 (*) 7 - 232 U/L   CK, MB 11.6 (*) 0.3 - 4.0 ng/mL   Comment: CRITICAL RESULT CALLED TO, READ BACK BY AND VERIFIED WITH:     W.WILLARD,RN  0102 04/06/14 M.CAMPBELL   Relative Index 0.3  0.0 - 2.5   Comment: Performed at [facility] are reviewed and are pertinent for UDS positive for benzos, marijuana and opiates.  Current Facility-Administered Medications  Medication Dose Route Frequency Provider Last Rate Last Dose  . acetaminophen (TYLENOL) tablet 650 mg  650 mg Oral Q6H PRN Eugenie Filler, MD       Or  . acetaminophen (TYLENOL) suppository 650 mg  650 mg Rectal Q6H PRN Eugenie Filler, MD      . albuterol (PROVENTIL) (2.5 MG/3ML) 0.083% nebulizer solution 2.5 mg  2.5 mg Nebulization Q2H PRN Eugenie Filler, MD      . enoxaparin (LOVENOX) injection 60 mg  60 mg Subcutaneous Q24H Eugenie Filler, MD   60 mg at 04/05/14 2139  . HYDROcodone-acetaminophen (NORCO/VICODIN) 5-325 MG per tablet 1-2  tablet  1-2 tablet Oral Q4H PRN Eugenie Filler, MD   2 tablet at 04/05/14 2030  . levofloxacin (LEVAQUIN) tablet 500 mg  500 mg Oral Daily Domenic Polite, MD   500 mg at 04/06/14 1049  . nicotine (NICODERM CQ - dosed in mg/24 hours) patch 14 mg  14 mg Transdermal Daily Ritta Slot, NP   14 mg at 04/06/14 1056  . ondansetron (ZOFRAN) tablet 4 mg  4 mg Oral Q6H PRN Eugenie Filler, MD       Or  . ondansetron [facility]) injection 4 mg  4 mg Intravenous Q6H PRN Eugenie Filler, MD      . pantoprazole (PROTONIX) EC tablet 40 mg  40 mg Oral Daily Eugenie Filler, MD   40 mg at 04/06/14 1049  . polyethylene glycol (MIRALAX / GLYCOLAX) packet 17 g  17 g Oral Daily PRN Eugenie Filler, MD      . sodium chloride 0.9 % injection 3 mL  3 mL Intravenous Q12H Eugenie Filler, MD   3 mL at 04/06/14 1057  . sorbitol 70 % solution 30 mL  30 mL Oral Daily PRN Eugenie Filler, MD        Psychiatric Specialty Exam:     Blood pressure 147/65, pulse 82, temperature 97.5 F (36.4 C), temperature source Oral, resp. rate 20, height _0  (1.88 m), weight 281 lb 4.9 oz (127.6 kg), SpO2 98.00%.Body mass index is 36.1  kg/(m^2).  General Appearance: Tearful  Eye Contact::  Poor  Speech:  Slow  Volume:  Decreased  Mood:  Anxious, Depressed, Dysphoric and Hopeless  Affect:  Constricted and Depressed  Thought Process:  Logical  Orientation:  Full (Time, Place, and Person)  Thought Content:  Rumination  Suicidal Thoughts:  Yes.  without intent/plan  Homicidal Thoughts:  No  Memory:  Immediate;   Fair Recent;   Fair Remote;   Fair  Judgement:  Intact  Insight:  Fair  Psychomotor Activity:  Decreased  Concentration:  Fair  Recall:  [employer] of Knowledge:Fair  Language: Fair  Akathisia:  No  Handed:  Right  AIMS (if indicated):     Assets:  Communication Skills Desire for Improvement Housing  Sleep:      Musculoskeletal: Strength & Muscle Tone: within normal limits Gait & Station: normal Patient leans: N/A  Treatment Plan Summary: I talked with the patient and his father in detail.  I offered voluntary inpatient psychiatric treatment which both agreed.  Once he is medically cleared, patient can be transferred to behavioral [place] for treatment of his depression.  Patient will be admitted voluntary .  Please call [phone] if there is any further question.  Arlyce Harman Arfeen 04/06/2014 3:03 PM

## 2014-04-06 NOTE — Progress Notes (Signed)
Per psych MD, Pt needing inpt tx.  [facility]  Per Tori, no beds available, at this time, however that could change.  MD made aware.  CSW to continue to follow.  Providence CrosbyAmanda Tynan Boesel, LCSWA Clinical Social Work [phone]

## 2014-04-06 NOTE — Progress Notes (Signed)
Writer received report of critical lab of CK-MB 11.6. MD on floor and writer alerted her of this lab result.

## 2014-04-06 NOTE — Progress Notes (Signed)
CSW consult received.  Awaiting psych eval and psych MD's recommendations.  CSW to continue to follow.  Providence CrosbyAmanda Truman Aceituno, LCSWA Clinical Social Work [phone]

## 2014-04-06 NOTE — Progress Notes (Signed)
[facility] PROGRESS NOTE  Antonio Sullivan UJW:[id number] DOB: 05-May-1996 DOA: 04/05/2014 PCP: No primary provider on file.  Assessment/Plan: #1.Drug overdose  Ingested multipleXanax tablets -denies any suicidal ideation/homicidal ideation.  -improved mentation -Psych eval pending  #2 mild rhabdomyolysis  -Found down due to OD -improving  #3 depression/polysubstance abuse   history of polysubstance abuse including heroin and anxiolytics -denies any suicidal ideation. No homicidal ideation. IVC papers have been taken out per patient's family -psych and CSW consult pending   #4 Aspiration pneumonia -asymptomatic -will treat with levaquin for 7 days -WBC improving  #5 sinus tachycardia  Likely secondary to dehydration, OD -resolved  #6 prophylaxis   Lovenox   Code Status: Full Code Family Communication:none at bedside Disposition Plan: home pending pSych clearance   Consultants:  PSych  CSW  Antibiotics:  levaquin  HPI/Subjective: Feels sleepy, ok otherwise  Objective: Filed Vitals:   04/06/14 0617  BP: 111/65  Pulse: 72  Temp: 97.5 F (36.4 C)  Resp: 24    Intake/Output Summary (Last 24 hours) at 04/06/14 1315 Last data filed at 04/06/14 1000  Gross per 24 hour  Intake 1997.5 ml  Output      0 ml  Net 1997.5 ml   Filed Weights   04/05/14 1905  Weight: 127.6 kg (281 lb 4.9 oz)    Exam:   General: sleepy, arousible, oriented x3  Cardiovascular: S1S2/RRR  Respiratory: CTAB  Abdomen: soft, NT, BS present  Musculoskeletal: no edema c/c   Data Reviewed: Basic Metabolic Panel:  Recent Labs Lab 04/05/14 1600 04/06/14 0430  NA 138 141  K 4.4 3.9  CL 96 106  CO2 25 24  GLUCOSE 98 96  BUN 15 16  CREATININE 0.87 0.90  CALCIUM 10.2 9.1  MG 1.8  --    Liver Function Tests:  Recent Labs Lab 04/05/14 1600 04/06/14 0430  AST 46* 51*  ALT 25 20  ALKPHOS 145* 106  BILITOT 1.0 0.5  PROT 8.5* 6.5  ALBUMIN 4.6 3.4*   No  results found for this basename: LIPASE, AMYLASE,  in the last 168 hours  Recent Labs Lab 04/05/14 1640  AMMONIA RESULTS UNAVAILABLE DUE TO INTERFERING SUBSTANCE   CBC:  Recent Labs Lab 04/05/14 1600 04/06/14 0430  WBC 20.3* 13.3*  NEUTROABS  --  7.9*  HGB 15.9 13.3  HCT 45.8 36.6*  MCV 82.5 84.3  PLT 365 234   Cardiac Enzymes:  Recent Labs Lab 04/05/14 1600 04/06/14 0430  CKTOTAL 4079* 3397*  CKMB  --  11.6*   BNP (last 3 results) No results found for this basename: PROBNP,  in the last 8760 hours CBG: No results found for this basename: GLUCAP,  in the last 168 hours  No results found for this or any previous visit (from the past 240 hour(s)).   Studies: Dg Chest 2 View  04/05/2014   CLINICAL DATA:  Altered mental status.  Leukocytosis.  EXAM: CHEST  2 VIEW  COMPARISON:  None.  FINDINGS: There is right upper and right lower lobe airspace disease most consistent with pneumonia. The left lung is clear. Heart size is normal. No pneumothorax or pleural effusion.  IMPRESSION: Right upper and lower lobe airspace disease most consistent with pneumonia.   Electronically Signed   By: Drusilla Kannerhomas  Dalessio M.D.   On: 04/05/2014 18:54    Scheduled Meds: . enoxaparin (LOVENOX) injection  60 mg Subcutaneous Q24H  . levofloxacin  500 mg Oral Daily  . nicotine  14 mg Transdermal Daily  .  pantoprazole  40 mg Oral Daily  . sodium chloride  3 mL Intravenous Q12H   Continuous Infusions:  Antibiotics Given (last 72 hours)   Date/Time Action Medication Dose   04/06/14 1049 Given   levofloxacin (LEVAQUIN) tablet 500 mg 500 mg      Principal Problem:   Acute encephalopathy Active Problems:   Rhabdomyolysis   Sinus tachycardia   Leukocytosis   Depression   Polysubstance abuse    Time spent: 25min    Zannie CovePreetha Leela Vanbrocklin  [facility] Pager [phone]. If 7PM-7AM, please contact night-coverage at [email or web address], password [email or web address] 04/06/2014, 1:15 PM  LOS: 1 day

## 2014-04-06 NOTE — Progress Notes (Addendum)
Clinical Social Work Department BRIEF PSYCHOSOCIAL ASSESSMENT 04/07/2014  Patient:  Antonio Sullivan, Antonio Sullivan     Account Number:  [id number]     Admit date:  04/05/2014  Clinical Social Worker:  Levie Heritage  Date/Time:  04/07/2014 09:17 AM  Referred by:  Physician  Date Referred:  04/07/2014 Referred for  Behavioral Health Issues   Other Referral:   Interview type:  Patient Other interview type:   Pt's father at bedside    PSYCHOSOCIAL DATA Living Status:  FAMILY Admitted from facility:   Level of care:   Primary support name:  Todd Primary support relationship to patient:  PARENT Degree of support available:   strong    CURRENT CONCERNS Current Concerns  Behavioral Health Issues   Other Concerns:    SOCIAL WORK ASSESSMENT / PLAN Psych MD unaware that Pt currently under IVC (IVC paperwork on chart).  Psych MD wanting Pt to remain under IVC and sent to [facility] when a bed is available.    CSW completed the "Examination and Recommendation to Determine Necessity for Involuntary Commitment" document, obtained MD's signature and place said document on chart.    CSW met with Pt and father and discussed the situation.    Pt voiced his frustration at the situation and stated that this is doing him no good, except to make him angrier.  Pt stated that it's likely that his drug use with escalate upon d/c due to being "so angry."  Pt had very little to say and kept his head down during the assessment.  Pt's father was visibly upset and stated to CSW, "This is so hard to watch."    Pt and CSW discussed Pt's current admission and the rationale for the IVC paperwork.  Pt stated that he didn't understand the reason for keeping him against his will, as he stated, "I'm not suicidal and I don't want to hurt anyone."  CSW attempted to explain the gravity of his drug use.  Pt didn't want to talk about that and indicated to CSW that he was finished talking by looking away and shaking his head.    CSW  attempted to offer emotional support and educate Pt about substance abuse tx.  Pt made little eye contact and began arguing with his dad about his sister's problems.    CSW left the room after the conversation between Pt and his father calmed down.    CSW offered Pt's father emotional support outside the room.    Pt and father understand that they can contact CSW via the RN for any needs.    CSW to continue to follow.   Assessment/plan status:  Other - See comment Other assessment/ plan:   Information/referral to community resources:   [facility]    PATIENT'S/FAMILY'S RESPONSE TO PLAN OF CARE: Pt was very angry about being hospitalized and about being under IVC.  Pt not interested in any sort of tx and stated, "I just want to go home."    Pt's father very emotional about Pt's current situation and is hopeful that Pt can finally get the care and attention he needs.   Bernita Raisin, [place] Work [phone]

## 2014-04-07 DIAGNOSIS — J690 Pneumonitis due to inhalation of food and vomit: Secondary | ICD-10-CM

## 2014-04-07 DIAGNOSIS — F329 Major depressive disorder, single episode, unspecified: Secondary | ICD-10-CM

## 2014-04-07 DIAGNOSIS — F3289 Other specified depressive episodes: Secondary | ICD-10-CM

## 2014-04-07 DIAGNOSIS — M6282 Rhabdomyolysis: Secondary | ICD-10-CM

## 2014-04-07 DIAGNOSIS — G9340 Encephalopathy, unspecified: Secondary | ICD-10-CM

## 2014-04-07 NOTE — Progress Notes (Signed)
[facility] Julie[facility] Cottonina[facility].  Inetta Fermoina to contact CSW when a bed becomes available.  Providence CrosbyAmanda Demetria Iwai, LCSWA Clinical Social Work [phone]

## 2014-04-07 NOTE — Progress Notes (Signed)
Pt seen and examined No changes, stable, [facility] CSW following-thank you  Zannie CovePreetha Dayane Hillenburg, MD [phone]

## 2014-04-07 NOTE — Progress Notes (Signed)
No beds at [facility]. No beds at [facility]. No beds at [facility]. No answer at [facility]. No answer at [facility]. LM for [facility].  Amanda Demya Scruggs, LCSWA Clinical Social Work [phone]   

## 2014-04-07 NOTE — Progress Notes (Signed)
Per Tina, AC at [facility], no discharges from the adult units today.  Amanda Leny Morozov, LCSWA Clinical Social Work [phone]  

## 2014-04-08 ENCOUNTER — Encounter (HOSPITAL_COMMUNITY): Payer: Self-pay | Admitting: *Deleted

## 2014-04-08 ENCOUNTER — Inpatient Hospital Stay (HOSPITAL_COMMUNITY)
Admission: AD | Admit: 2014-04-08 | Discharge: 2014-04-10 | DRG: 897 | Disposition: A | Discharge status: Home / Self Care | Payer: BC Managed Care – PPO | Source: Intra-hospital | Attending: Psychiatry | Admitting: Psychiatry

## 2014-04-08 DIAGNOSIS — F17200 Nicotine dependence, unspecified, uncomplicated: Secondary | ICD-10-CM | POA: Diagnosis present

## 2014-04-08 DIAGNOSIS — F1994 Other psychoactive substance use, unspecified with psychoactive substance-induced mood disorder: Secondary | ICD-10-CM | POA: Diagnosis present

## 2014-04-08 DIAGNOSIS — F1320 Sedative, hypnotic or anxiolytic dependence, uncomplicated: Secondary | ICD-10-CM | POA: Diagnosis present

## 2014-04-08 DIAGNOSIS — F321 Major depressive disorder, single episode, moderate: Secondary | ICD-10-CM | POA: Diagnosis present

## 2014-04-08 DIAGNOSIS — G4700 Insomnia, unspecified: Secondary | ICD-10-CM | POA: Diagnosis present

## 2014-04-08 DIAGNOSIS — F32A Depression, unspecified: Secondary | ICD-10-CM

## 2014-04-08 DIAGNOSIS — F411 Generalized anxiety disorder: Secondary | ICD-10-CM | POA: Diagnosis present

## 2014-04-08 DIAGNOSIS — F1310 Sedative, hypnotic or anxiolytic abuse, uncomplicated: Secondary | ICD-10-CM

## 2014-04-08 DIAGNOSIS — F1120 Opioid dependence, uncomplicated: Principal | ICD-10-CM

## 2014-04-08 DIAGNOSIS — F329 Major depressive disorder, single episode, unspecified: Secondary | ICD-10-CM | POA: Diagnosis present

## 2014-04-08 MED ORDER — ACETAMINOPHEN 325 MG PO TABS: 650.0000 mg | ORAL_TABLET | Freq: Four times a day (QID) | ORAL | Status: DC | PRN

## 2014-04-08 MED ORDER — MAGNESIUM HYDROXIDE 400 MG/5ML PO SUSP: 30.0000 mL | Freq: Every day | ORAL | Status: DC | PRN

## 2014-04-08 MED ORDER — IBUPROFEN 600 MG PO TABS
600.0000 mg | ORAL_TABLET | Freq: Four times a day (QID) | ORAL | Status: DC | PRN
  Administered 2014-04-08: 600 mg via ORAL
  Filled 2014-04-08: qty 1

## 2014-04-08 MED ORDER — NICOTINE 21 MG/24HR TD PT24
21.0000 mg | MEDICATED_PATCH | Freq: Every day | TRANSDERMAL | Status: DC
  Administered 2014-04-09: 21 mg via TRANSDERMAL
  Filled 2014-04-08 (×4): qty 1

## 2014-04-08 MED ORDER — PANTOPRAZOLE SODIUM 40 MG PO TBEC
40.0000 mg | DELAYED_RELEASE_TABLET | Freq: Every day | ORAL | Status: DC
  Administered 2014-04-09 – 2014-04-10 (×2): 40 mg via ORAL
  Filled 2014-04-08 (×4): qty 1

## 2014-04-08 MED ORDER — ALUM & MAG HYDROXIDE-SIMETH 200-200-20 MG/5ML PO SUSP: 30.0000 mL | ORAL | Status: DC | PRN

## 2014-04-08 MED ORDER — TRAZODONE HCL 50 MG PO TABS
50.0000 mg | ORAL_TABLET | Freq: Every evening | ORAL | Status: DC | PRN
  Administered 2014-04-08 – 2014-04-09 (×2): 50 mg via ORAL
  Filled 2014-04-08: qty 28
  Filled 2014-04-08: qty 1
  Filled 2014-04-08: qty 28
  Filled 2014-04-08 (×6): qty 1

## 2014-04-08 MED ORDER — LEVOFLOXACIN 500 MG PO TABS
500.0000 mg | ORAL_TABLET | Freq: Every day | ORAL | Status: DC
  Administered 2014-04-09 – 2014-04-10 (×2): 500 mg via ORAL
  Filled 2014-04-08 (×2): qty 1
  Filled 2014-04-08: qty 5
  Filled 2014-04-08: qty 1

## 2014-04-08 NOTE — Progress Notes (Addendum)
Clinical Social Work  Per MD, patient is medically stable for DC. CSW attempted to meet with patient but patient on the phone and requested for CSW to com back at later time. CSW contacted the following facilities re: placement:  [facility]- no available beds  [facility]- no available beds  [facility]  [facility]  [facility] Areola(Eric) unsure of bed status but has referral in case able to accept today  [facility]- [facility]  [facility]- possible DC this afternoon. Referral faxed  [facility] this [facility]. Referral faxed.  [facility]- possible [facility] this afternoon. Referral faxed.  [facility]- no available beds  [facility]  [facility]- available beds. Referral faxed.  [facility]- possible [facility] this afternoon. Referral faxed.  [facility]. Referral faxed.  [facility]- no available beds  [facility]- available beds. Referral faxed. (Addendeum 1340: Declined by [facility] JohnWarren on 4/27)  [facility]- left message at 9:52am  CSW will continue to follow.  [place] Vernel Sullivan, [place] [phone]

## 2014-04-08 NOTE — Progress Notes (Signed)
Patient ID: Antonio Sullivan, male   DOB: 02/16/1996, 18 y.o.   MRN: [id number] Nursing admission note:  Patient admitted involuntarily by mother.  Patient stated that he was at his dad's home, walked two step into the house and passed out.  Mother found him down on the floor with increased respirations.  Patient admits to polysubstance abuse, mainly heroin and xanax.  He report using 1/4 gram daily of heroin and xanax when he can get it.  He denies any SI/HI/AVH.  He reports depressive symptoms such as tearfulness, sleeplessness and hopelessness. Patient states last use 2-3 weeks prior to admission.  Patient states he took (5) 2 mg xanax tablets in order to get some leep.  Patient states he has been depressed since his grandfather pass away on 08/11/13.  Patient states he drinks one 40 per week.  Patient was tachycardic in the ED with a heart rate of 150.  Patient also stated that he was treated for pneumonia recently.  Patient was brought on unit and offered food and fluids.

## 2014-04-08 NOTE — Tx Team (Signed)
Initial Interdisciplinary Treatment Plan  PATIENT STRENGTHS: (choose at least two) Average or above average intelligence Communication skills Physical Health Supportive family/friends Work skills  PATIENT STRESSORS: Marital or family conflict Substance abuse   PROBLEM LIST: Problem List/Patient Goals Date to be addressed Date deferred Reason deferred Estimated date of resolution  Polysubstance abuse 04/08/14     Depression 04/08/14     Hopelessness 04/08/14                                          DISCHARGE CRITERIA:  Ability to meet basic life and health needs Adequate post-discharge living arrangements  PRELIMINARY DISCHARGE PLAN: Attend 12-step recovery group Return to previous living arrangement  PATIENT/FAMIILY INVOLVEMENT: This treatment plan has been presented to and reviewed with the patient, Antonio Sullivan.  The patient and family have been given the opportunity to ask questions and make suggestions.  Cresenciano GenreCaroline Evans Michaelene Dutan 04/08/2014, 6:15 PM

## 2014-04-08 NOTE — Progress Notes (Signed)
Clinical Social Work  Patient accepted to [facility] 303-2. CSW faxed IVC paperwork and [facility] Randall Hiss) reviewed and agreeable to accept patient. CSW met with patient at bedside when grandmother and friend were in room. Patient preferred that visitors step out of room when CSW met with him. CSW explained DC to [facility] and that [facility] would transport since under IVC. Patient tearful and worried about staying at [facility]. CSW spoke with patient and reassured him that he would receive good care at [facility]. CSW offered to inform family of DC but patient reports he will call who he wants them to know. RN called report. CSW coordinated transportation via [employer].   CSW made MD aware of DC plans. CSW is signing off but available if needed.  [place], [place] [phone]
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
## 2014-04-08 NOTE — Plan of Care (Signed)
Problem: Consults Goal: General Medical Patient Education See Patient Education Module for specific education.  Outcome: Completed/Met Date Met:  04/08/14 Family given pkt

## 2014-04-09 DIAGNOSIS — F1120 Opioid dependence, uncomplicated: Principal | ICD-10-CM

## 2014-04-09 DIAGNOSIS — F1310 Sedative, hypnotic or anxiolytic abuse, uncomplicated: Secondary | ICD-10-CM

## 2014-04-09 DIAGNOSIS — F329 Major depressive disorder, single episode, unspecified: Secondary | ICD-10-CM

## 2014-04-09 DIAGNOSIS — F3289 Other specified depressive episodes: Secondary | ICD-10-CM

## 2014-04-09 MED ORDER — MAGNESIUM CITRATE PO SOLN
1.0000 | Freq: Once | ORAL | Status: AC
  Administered 2014-04-09: 1 via ORAL

## 2014-04-09 NOTE — [facility] Group Notes (Signed)
[facility] LCSW Group Therapy  04/09/2014   1:15 PM   Type of Therapy:  Group Therapy  Participation Level:  Active  Participation Quality:  Attentive, Sharing and Supportive  Affect:  Depressed and Flat  Cognitive:  Alert and Oriented  Insight:  Developing/Improving and Engaged  Engagement in Therapy:  Developing/Improving and Engaged  Modes of Intervention:  Activity, Clarification, Confrontation, Discussion, Education, Exploration, Limit-setting, Orientation, Problem-solving, Rapport Building, Reality Testing, Socialization and Support  Summary of Progress/Problems: Patient was attentive and engaged with speaker from [employer].  Patient was attentive to speaker while they shared their story of dealing with mental health and overcoming it.  Patient expressed interest in their programs and services and received information on their agency.  Patient processed ways they can relate to the speaker.     Antonio Miranda Horton, LCSW 04/09/2014  1:32 PM    

## 2014-04-09 NOTE — Progress Notes (Signed)
Pt attend AA group this evening.  

## 2014-04-09 NOTE — Progress Notes (Signed)
D Pt. Denies SI and HI, no complaints of pain or discomfort noted at this time,.  A Writer offered support and encouragement.  R Pt. Remains safe on the unit.  Pt. Appears to be saying what staff wants to hear possibly just to get by and be discharged

## 2014-04-09 NOTE — Progress Notes (Signed)
Adult Psychoeducational Group Note  Date:  04/09/2014 Time:  11:49 PM  Group Topic/Focus:  Self Care:   The focus of this group is to help patients understand the importance of self-care in order to improve or restore emotional, physical, spiritual, interpersonal, and financial health.  Participation Level:  Active  Participation Quality:  Appropriate  Affect:  Appropriate  Cognitive:  Appropriate  Insight: Appropriate  Engagement in Group:  Engaged  Modes of Intervention:  Support  Additional Comments:  NA/AA guest speaker. Pt was attentive and appropriate.  Giselle Brutus 04/09/2014, 11:49 PM

## 2014-04-09 NOTE — [facility] Suicide Risk Assessment (Signed)
Suicide Risk Assessment  Admission Assessment     Nursing information obtained from:    Demographic factors:    Current Mental Status:    Loss Factors:    Historical Factors:    Risk Reduction Factors:    Total Time spent with patient: 45 minutes  CLINICAL FACTORS:   Depression:   Comorbid alcohol abuse/dependence Alcohol/Substance Abuse/Dependencies  PCOGNITIVE FEATURES THAT CONTRIBUTE TO RISK:  Closed-mindedness Polarized thinking Thought constriction (tunnel vision)    SUICIDE RISK:   Moderate:    PLAN OF CARE: Supportive approach/coping skills/relapse prevention                               Detox if needed                                Reassess and address the co morbidities  I certify that inpatient services furnished can reasonably be expected to improve the patient's condition.  Rachael Feerving A Arris Meyn 04/09/2014, 5:25 PM
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
## 2014-04-09 NOTE — [facility] Counselor (Signed)
Adult Comprehensive Assessment  Patient ID: Antonio Sullivan, male   DOB: [birth year], 18 y.o.   MRN: [id number]  Information Source: Information source: Patient  Current Stressors:  Educational / Learning stressors: currently in school Employment / Job issues: employed for past three years  Family Relationships: close to parents and sister Surveyor, quantityinancial / Lack of resources (include bankruptcy): income from employment Housing / Lack of housing: lives with father for past four years Physical health (include injuries & life threatening diseases): getting over pneumonia  Social relationships: close friends that are supportive of him Substance abuse: heroin daily for past several months (since death of grandfather in [place] 2014) and xanax daily-"whatever I can find and buy for past year" no alcohol use identified.  Bereavement / Loss: grandfather passed away   Living/Environment/Situation:  Living Arrangements: Parent Living conditions (as described by patient or guardian): apt in [place]  How long has patient lived in current situation?: 4 years What is atmosphere in current home: Comfortable;Loving  Family History:  Marital status: Single Does patient have children?: No  Childhood History:  By whom was/is the patient raised?: Both parents Additional childhood history information: my parents were married until I was [age] and they divorced. Mom had lupus and was bedriden alot. My dad worked Theme park manageralot. He slept alot. Smoked pot alot. Description of patient's relationship with caregiver when they were a child: Close to mother. Father is a Scientist, forensicstern man Patient's description of current relationship with people who raised him/her: close to both parents. strained with mother currently due to IVC.  Does patient have siblings?: Yes Number of Siblings: 1 Description of patient's current relationship with siblings: older sister lives with mom in [place].  Did patient suffer any verbal/emotional/physical/sexual  abuse as a child?: No Did patient suffer from severe childhood neglect?: No Has patient ever been sexually abused/assaulted/raped as an adolescent or adult?: No Was the patient ever a victim of a crime or a disaster?: No Witnessed domestic violence?: No Has patient been effected by domestic violence as an adult?: No  Education:  Highest grade of school patient has completed: community college. currently enrolled.  Currently a student?: Yes If yes, how has current illness impacted academic performance: I'm missing school right now and need a letter. 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  Learning disability?: No  Employment/Work Situation:   Employment situation: Employed Where is patient currently employed?: [employer] scheme [employer] in [employer] How long has patient been employed?: 3 years Patient's job has been impacted by current illness: Yes Describe how patient's job has been impacted: I'm missing work now. I also came to work messed up and were concerned about me.  What is the longest time patient has a held a job?: 3 years Where was the patient employed at that time?: [employer]  Has patient ever been in the Eli Lilly and Companymilitary?: No Has patient ever served in combat?: No  Financial Resources:   Surveyor, quantityinancial resources: Income from [employer] Corporationemployment;Private insurance;Support from parents / caregiver Does patient have a representative payee or guardian?: No  Alcohol/Substance Abuse:   What has been your use of drugs/alcohol within the last 12 months?: I'm not a big drinker. heroin-quarter gram a day since October 2014-my grandfather passed away and I was dealing with depression. xanax-I've been using whatever I could get since age 18.  If attempted suicide, did drugs/alcohol play a role in this?: No (occassional thoughts in the past but no attempts. I've  been depressed for 3/4 years. ) Alcohol/Substance Abuse Treatment Hx: Denies past  history If yes, describe treatment: n/a Has alcohol/substance abuse ever caused legal problems?: No  Social Support System:   Patient's Community Support System: Good Describe Community Support System: I have good friends in the community. Type of faith/religion: Christianity How does patient's faith help to cope with current illness?: prayer/church.   Leisure/Recreation:   Leisure and Hobbies: fishing; produce music-beats  Strengths/Needs:   What things does the patient do well?: music; cooking;  In what areas does patient struggle / problems for patient: my depression  Discharge Plan:   Does patient have access to transportation?: Yes (drive and have car) Will patient be returning to same living situation after discharge?: Yes (plan to return home with dad) Currently receiving community mental health services: No If no, would patient like referral for services when discharged?: Yes (What county?) Medical sales representative([place]) Does patient have financial barriers related to discharge medications?: No  Summary/Recommendations:    Pt is 18 year old [place] his [place]. [facility], mood stabilization, and medication management. Recommendations for pt include: crisis stabilization, therapeutic milieu, encourage group attendance and participation, medication management for mood stabilization (not currently on taper for withdrawals), and development of comprehensive mental wellness/sobriety plan. Pt requesting referral for psychiatrist but is refusing therapy/CDIOP referral. Pt encouraged by CSW to consider NA groups and Support groups offered by [employer] [facility] [employer]. Pt given brochures and information.   The Sherwin-WilliamsHeather Smart. LCSWA 04/09/2014
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
## 2014-04-09 NOTE — Progress Notes (Signed)
The focus of this group is to educate the patient on the purpose and policies of crisis stabilization and provide a format to answer questions about their admission.  The group details unit policies and expectations of patients while admitted. Patient attended this group and was engaging. 

## 2014-04-09 NOTE — Progress Notes (Signed)
Recreation Therapy Notes  Animal-Assisted Activity/Therapy (AAA/T) Program Checklist/Progress Notes Patient Eligibility Criteria Checklist & Daily Group note for Rec Tx Intervention  Date: 04.28.2015 Time: 2:45pm [address]    AAA/T Program Assumption of Risk Form signed by Patient/ or Parent Legal Guardian yes  Patient is free of allergies or sever asthma yes  Patient reports no fear of animals yes  Patient reports no history of cruelty to animals yes   Patient understands his/her participation is voluntary yes  Patient washes hands before animal contact yes  Patient washes hands after animal contact yes  Behavioral Response: Appropriate   Education: Hand Washing, Appropriate Animal Interaction   Education Outcome: Acknowledges understanding  Clinical Observations/Feedback: Patient interacted appropriately with therapy dog and staff.   Marykay Lexenise L Xaiden Fleig, LRT/CTRS  Jearl KlinefelterDenise L Ionna Avis 04/09/2014 4:32 PM

## 2014-04-09 NOTE — [facility] Suicide Risk Assessment (Signed)
[facility] INPATIENT:  Family/Significant Other Suicide Prevention Education  Suicide Prevention Education:  Education Completed; Loreta Aveodd Marcotte (pt's father) [phone] has been identified by the patient as the family member/significant other with whom the patient will be residing, and identified as the person(s) who will aid the patient in the event of a mental health crisis (suicidal ideations/suicide attempt).  With written consent from the patient, the family member/significant other has been provided the following suicide prevention education, prior to the and/or following the discharge of the patient.  The suicide prevention education provided includes the following:  Suicide risk factors  Suicide prevention and interventions  National Suicide Hotline telephone number  [facility] assessment telephone number  [facility] Emergency Assistance 911  [facility] and/or Residential Mobile Crisis Unit telephone number  Request made of family/significant other to:  Remove weapons (e.g., guns, rifles, knives), all items previously/currently identified as safety concern.    Remove drugs/medications (over-the-counter, prescriptions, illicit drugs), all items previously/currently identified as a safety concern.  The family member/significant other verbalizes understanding of the suicide prevention education information provided.  The family member/significant other agrees to remove the items of safety concern listed above  The Sherwin-WilliamsHeather Smart LCSWA 04/09/2014, 2:08 PM

## 2014-04-10 LAB — HEPATITIS PANEL, ACUTE
HCV Ab: NEGATIVE
HEP A IGM: NONREACTIVE
HEP B C IGM: NONREACTIVE
Hepatitis B Surface Ag: NEGATIVE

## 2014-04-10 MED ORDER — TRAZODONE HCL 50 MG PO TABS: 50.0000 mg | ORAL_TABLET | Freq: Every evening | ORAL | Status: DC | PRN

## 2014-04-10 MED ORDER — PNEUMOCOCCAL VAC POLYVALENT 25 MCG/0.5ML IJ INJ
0.5000 mL | INJECTION | Freq: Once | INTRAMUSCULAR | Status: AC
  Administered 2014-04-10: 0.5 mL via INTRAMUSCULAR

## 2014-04-10 MED ORDER — LEVOFLOXACIN 500 MG PO TABS: 500.0000 mg | ORAL_TABLET | Freq: Every day | ORAL | Status: DC

## 2014-04-10 NOTE — Discharge Summary (Signed)
Physician Discharge Summary Note  Patient:  Antonio Sullivan is an 18 y.o., male MRN:  [id number] DOB:  1996-03-14 Patient phone:  [phone] (home)  Patient address:   [address] [place] [address],  Total Time spent with patient: Greater than 30 minutes  Date of Admission:  04/08/2014  Date of Discharge: 04/10/14  Reason for Admission:  Opioid/Benzodiazepine detox  Discharge Diagnoses: Active Problems:   MDD (major depressive disorder)   Opioid dependence   Benzodiazepine abuse   Psychiatric Specialty Exam: Physical Exam  Cardiovascular: Normal rate.   Respiratory: Effort normal.  GI: Soft.  Psychiatric: His speech is normal and behavior is normal. Judgment and thought content normal. His mood appears not anxious. His affect is not angry, not blunt, not labile and not inappropriate. Cognition and memory are normal. He does not exhibit a depressed mood.    Review of Systems  Constitutional: Negative.   HENT: Negative.   Eyes: Negative.   Respiratory: Negative.   Cardiovascular: Negative.   Gastrointestinal: Negative.   Genitourinary: Negative.   Musculoskeletal: Negative.   Skin: Negative.   Neurological: Negative.   Endo/Heme/Allergies: Negative.   Psychiatric/Behavioral: Positive for depression (Stable) and substance abuse (Hx. Benzodiazepine/opioid dependence). Negative for suicidal ideas, hallucinations and memory loss. The patient has insomnia (Stabilized with medication prior to discharge). The patient is not nervous/anxious.     Blood pressure 135/96, pulse 96, temperature 97.7 F (36.5 C), temperature source Oral, resp. rate 20, height 6\' 2"  (1.88 m), weight 125.193 kg (276 lb).Body mass index is 35.42 kg/(m^2).    Past Psychiatric History: Diagnosis: Opioid Disorder - Severe (304.00), Benzodiazepine dependence, Hx: MDD  [facility]ospitalizations: [facility] adult [facility]  [facility]  [facility]  Self-Mutilation: NA   Suicidal Attempts: NA  Violent Behaviors: NA   Musculoskeletal: Strength & Muscle Tone: within normal limits Gait & Station: normal Patient leans: N/A  DSM5: Schizophrenia Disorders:  NA Obsessive-Compulsive Disorders:  NA Trauma-Stressor Disorders:  NA Substance/Addictive Disorders:  Opioid Disorder - Severe (304.00), Benzodiazepine dependence Depressive Disorders:  Hx: Major depressive disorder  Axis Diagnosis:  AXIS I:  Opioid Disorder - Severe (304.00), Benzodiazepine dependence, Hx: MDD AXIS II:  Deferred AXIS III:   Past Medical History  Diagnosis Date  . Polysubstance abuse 04/05/2014   AXIS IV:  other psychosocial or environmental problems and Polysubstance dependenc AXIS V:  63  Level of Care:  OP  Hospital Course:  18 Y/O male who states that he relapsed Friday morning: did heroin and Xanax. Was using heroin heavily in November. He started working on decreasing its use. States since march could go 2 and three weeks would use once Thursday night he does not remember. States he was hanging out with a sober buddy of his, another friend came by, he had Xanax, so he took some. He ended up using heroin too. Later that night he was acting strange and was dropped at his father's house.  Ifeoluwa's stay in this hospital was rather very brief. Although [facility] with UDS reports indicating positive Benzodiazepine, opiates and THC, he was not presenting with any withdrawal symptoms of these substances. He may have finished detoxing from these substances while [facility]. Antonio did not receive any detoxification treatment protocols. However, he was medicated with Trazodone 50 mg Q bedtime for sleep. He also received Levaquin 500 mg daily for infection.  Nikolaj's condition is stabilized and he is currently being  discharged to his home to continue psychiatric treatment on an outpatient at the [facility] with Dr.  Lolly Mustache. He is provided with all the pertinent information he will need to make this appointment without problems. Upon discharge, [place] adamantly denies any SIHI, AVH, delusional thoughts, paranoia and or withdrawal symptoms. He was provided with a 4 days worth supply samples of his [facility] discharge medications and the remaining doses of his antibiotic therapy. He left [facility] with all personal belongings in no apparent distress. Transportation per parents.   Consults:  psychiatry  Significant Diagnostic Studies:  labs: CBC with diff, CMP, UDS, toxicology tests, U/A  Discharge Vitals:   Blood pressure 135/96, pulse 96, temperature 97.7 F (36.5 C), temperature source Oral, resp. rate 20, height 6\' 2"  (1.88 m), weight 125.193 kg (276 lb). Body mass index is 35.42 kg/(m^2). Lab Results:   Results for orders placed during the hospital encounter of 04/08/14 (from the past 72 hour(s))  HEPATITIS PANEL, ACUTE     Status: None   Collection Time    04/09/14  8:25 PM      Result Value Ref Range   Hepatitis B Surface Ag NEGATIVE  NEGATIVE   HCV Ab NEGATIVE  NEGATIVE   Hep A IgM NON REACTIVE  NON REACTIVE   Hep B C IgM NON REACTIVE  NON REACTIVE   Comment: (NOTE)     High levels of Hepatitis B Core IgM antibody are detectable     during the acute stage of Hepatitis B. This antibody is used     to differentiate current from past HBV infection.     Performed at [facility]    Physical Findings: AIMS: Facial and Oral Movements Muscles of Facial Expression: None, normal Lips and Perioral Area: None, normal Jaw: None, normal Tongue: None, normal,Extremity Movements Upper (arms, wrists, hands, fingers): None, normal Lower (legs, knees, ankles, toes): None, normal, Trunk Movements Neck, shoulders, hips: None, normal, Overall Severity Severity of abnormal movements (highest score from questions above): None, normal Incapacitation due to abnormal movements: None, normal Patient's awareness of  abnormal movements (rate only patient's report): No Awareness, Dental Status Current problems with teeth and/or dentures?: No Does patient usually wear dentures?: No  CIWA:    COWS:     Psychiatric Specialty Exam: See Psychiatric Specialty Exam and Suicide Risk Assessment completed by Attending Physician prior to discharge.  Discharge destination:  Home  Is patient on multiple antipsychotic therapies at discharge:  No   Has Patient had three or more failed trials of antipsychotic monotherapy by history:  No  Recommended Plan for Multiple Antipsychotic Therapies: NA     Medication List       Indication   levofloxacin 500 MG tablet  Commonly known as:  LEVAQUIN  Take 1 tablet (500 mg total) by mouth daily. For infection   Indication:  Infection     traZODone 50 MG tablet  Commonly known as:  DESYREL  Take 1 tablet (50 mg total) by mouth at bedtime and may repeat dose one time if needed. For sleep   Indication:  Trouble Sleeping       Follow-up Information   Follow up with [facility] On 04/30/2014. (Appointment with Dr. Lolly Mustache for medication management at 8:30AM. Make sure to bring completed New Patient Packet and insurance card with you. )    Contact information:   [address] [place], [place] [address] Phone: [phone] Fax: [phone]     Follow-up recommendations: Activity:  As tolerated Diet: As recommended by your primary care doctor. Keep all scheduled follow-up appointments as recommended.   Comments: Take all your medications as prescribed by your mental healthcare provider. Report any adverse effects and or reactions from your medicines to your outpatient provider promptly. Patient is instructed and cautioned to not engage in alcohol and or illegal drug use while on prescription medicines. In the event of worsening symptoms, patient is instructed to call the crisis hotline, 911 and or go to the nearest ED for appropriate  evaluation and treatment of symptoms. Follow-up with your primary care provider for your other medical issues, concerns and or health care needs.    Total Discharge Time:  Greater than 30 minutes.  Signed: Sanjuana Kavagnes I Nwoko, PMHNP-BC 04/10/2014, 11:02 AM Personally evaluated the patient and agree with assessment and plan Madie RenoIrving A. Dub MikesLugo, M.D.

## 2014-04-10 NOTE — Progress Notes (Signed)
Patient ID: Antonio Sullivan, male   DOB: Nov 18, 1996, 18 y.o.   MRN: [id number] He has been discharged home and was picked up  By his grandmother. He voiced understanding of discharge instruction and of follow up plan. He denies thoughts of SI and all his belongings were taken home with him. He said that he plans to attend NA groups and maybe get a second job.
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
## 2014-04-10 NOTE — Progress Notes (Signed)
[facility] Dis[facility] :  Will you be returning to the same living situation after discharge: Yes,  home with father At discharge, do you have transportation home?:Yes,  parents Do you have the ability to pay for your medications:Yes,  [employer] insurance  Release of information consent forms completed and submitted to Medical Records by CSW.  Patient to Follow up at: Follow-up Information   Follow up with [facility] On 04/30/2014. (Appointment with Dr. Lolly MustacheArfeen for medication management at 8:30AM. Make sure to bring completed New Patient Packet and insurance card with you. )    Contact information:   [address] [place], [place] [address] Phone: [phone] Fax: [phone]      Patient denies SI/HI:   Yes,  during admission,group, self report.    Safety Planning and Suicide Prevention discussed:  Yes,  Due to IVC, SPE completed with pt's father, who expressed no concern for pt regarding possibility of SI. SPI pamphlet provided to [place] and he was encouraged to share information with support network, ask questions, and talk about concerns relating to SPE.  [name] Smart LCSWA  04/10/2014, 11:40 AM
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
## 2014-04-10 NOTE — [facility] Group Notes (Signed)
[facility]   04/10/2014 11:33 AM  Participation Quality:  Appropriate   Mood/Affect:  Appropriate  Depression Rating:  2  Anxiety Rating:  5 "I really want to go home."   Thoughts of Suicide:  No Will you contract for safety?   NA  Current AVH:  No  Plan for Discharge/Comments:  Pt reports that he is in process of getting sponsor and plans to return to church, change his phone number, and follow up for med management with [facility]app[facility] scheduled. Pt also plans to attend NA and [facility] [place] [facility] [place] [facility] [place] [place]   Transportation Means: parent   Supports: parents and grandmother.   Demontray Franta Smart LCSWA 04/10/2014 11:35 AM

## 2014-04-15 NOTE — Progress Notes (Signed)
Patient Discharge Instructions:  Next Level Care Provider Has Access to the EMR, 04/15/14 [facility] via [facility]/Epic access.  Jerelene ReddenSheena E Plevna, 04/15/2014, 2:00 PM

## 2014-04-30 ENCOUNTER — Ambulatory Visit (INDEPENDENT_AMBULATORY_CARE_PROVIDER_SITE_OTHER): Payer: BC Managed Care – PPO | Admitting: Psychiatry

## 2014-04-30 ENCOUNTER — Encounter (HOSPITAL_COMMUNITY): Payer: Self-pay | Admitting: Psychiatry

## 2014-04-30 VITALS — BP 129/79 | HR 80 | Ht 74.0 in | Wt 292.0 lb

## 2014-04-30 DIAGNOSIS — F329 Major depressive disorder, single episode, unspecified: Secondary | ICD-10-CM

## 2014-04-30 DIAGNOSIS — F339 Major depressive disorder, recurrent, unspecified: Secondary | ICD-10-CM

## 2014-04-30 DIAGNOSIS — F3289 Other specified depressive episodes: Secondary | ICD-10-CM

## 2014-04-30 DIAGNOSIS — F1920 Other psychoactive substance dependence, uncomplicated: Secondary | ICD-10-CM

## 2014-04-30 MED ORDER — TRAZODONE HCL 50 MG PO TABS: 50.0000 mg | ORAL_TABLET | Freq: Every day | ORAL | Status: AC

## 2014-04-30 MED ORDER — BUPROPION HCL ER (XL) 150 MG PO TB24: 150.0000 mg | ORAL_TABLET | ORAL | Status: AC

## 2014-04-30 NOTE — Progress Notes (Signed)
[facility] Initial Assessment Note  Antonio Sullivan [id number] 18 y.o.  04/30/2014 9:47 AM  Chief Complaint:  I'm here for a checkup.  History of Present Illness:  Patient is a 18 year old Caucasian, employed single man who was referred from inpatient psychiatric services for the continuation of treatment.  The patient was admitted to behavioral [facility] from April 27 to April 29.  He was admitted because of drug use.  He was using heroin heavily for past few months.  Patient told he was given Xanax by his friend and he passed out at his father's house.  The patient admitted history of depression and using drugs since age 40.  Patient reported his depression his thought when his parents split up when he was 55 years old.  He started using drugs and benzodiazepine.  He was using cannabis , Xanax and drinking until last 09-23-2023 he started to use heavily opiates.  He was using intravascular heroin .  Patient reported multiple stressors in his life.  His grandfather deceased last year in 09-23-23 and that trigger his drug use.  Patient reported unstable living situation because since his parents split up he has moved so many places that she does not like it.  Currently he is living with his father because he believes it is more stable than living with his mother.  However he does not like to much intrusion since his grandmother and older sister is also living there.  Patient reported his sister also has drug problem.  Patient endorses lately he slipped in the labs into heroine and marijuana .  His last use was on May 18 .  He said that he was very upset on his grandmother who is trying to control his life.  Patient admitted that everyone has good intention but he does not like intrusion in his life.  Patient admitted irritability, anger, mood swings, lack of motivation .  He also endorsed some time hopeless and helpless but denies any suicidal thoughts.  Patient was discharged on trazodone  however he has not taken on a regular basis because sometimes it makes him very groggy and sleepy.  He was not given any antidepressant.  Patient endorsed lot of weight gain in the past few months because he does not want to do anything.  He admitted isolated and withdrawn and does not like to be involved in any activity.  He endorsed that he rather left alone because he does not like talking to anyone.  Patient denies any paranoia, hallucination but endorsed easily irritable and angry.  He endorsed some time filling of worthlessness and hopelessness.  He described his mood as sad and irritable.  He is not seeing any therapist.  However he like to get some help and he went "try some medication.  Patient denies any nightmares, flashback, panic attacks, any obsessive or compulsive thoughts.  He is going to an [employer].  He does not drink.  He denies any suicidal thoughts or homicidal thoughts.  He denies any mania or any psychotic symptoms.   Suicidal Ideation: No Plan Formed: No Patient has means to carry out plan: No  Homicidal Ideation: No Plan Formed: No Patient has means to carry out plan: No  Past Psychiatric History/Hospitalization(s) Patient endorse his depression started when he was 18 years old.  At that time his parents split up and he was very disturbed.  He was seen by his physician who started him on Wellbutrin however he stopped because he felt did  not help.  At age 18 he was again relapsed into severe depression when he was living in different places and he was not happy with his life.  He was given Xanax by his physician.  Patient has one psychiatric hospitalization in April 2015 when he was using very heavy heroin and cannabis.  Patient denies any history of suicidal attempt in the past but admitted history of depression and suicidal thinking.  Patient denies any history of mania or any psychosis.  She denies any history of physical, sexual, verbal and emotional abuse. Anxiety:  Yes Bipolar Disorder: No Depression: Yes Mania: No Psychosis: No Schizophrenia: No Personality Disorder: No Hospitalization for psychiatric illness: Yes History of Electroconvulsive Shock Therapy: No Prior Suicide Attempts: No  Medical History; Patient has elevated CPK when he was in the hospital.  He is history of polysubstance use.  Traumatic brain injury: Patient denies any traumatic brain injury.  Family History; Patient endorsed mother has history of marijuana use and taking Prozac.  Father has history of using marijuana and taken Wellbutrin.  His sister has history of drug use.  Education and Work History; Patient is enrolled in [employer].  He finished his high school.  He is working in [employer] for more than 3 years.  Psychosocial History; Patient was born and raised in [place].  His parents separated when he was only 79 years old.  He lived around many places.  Currently he is living with his father because he believed this is more stable living situation.  His grandmother and sister is also living there.  Patient is contact with his mother very frequently.  He is single and he has no children.  Legal History; Patient denies any legal history.  History Of Abuse; Patient denies any history of abuse.  Substance Abuse History; Patient endorses history of using marijuana at age 34.  Patient has a history of using benzodiazepine and intravenous heroin.    Review of Systems: Psychiatric: Agitation: No Hallucination: No Depressed Mood: Yes Insomnia: Yes Hypersomnia: No Altered Concentration: No Feels Worthless: Yes Grandiose Ideas: No Belief In Special Powers: No New/Increased Substance Abuse: Yes Compulsions: No  Neurologic: Headache: No Seizure: No Paresthesias: No    Outpatient Encounter Prescriptions as of 04/30/2014  Medication Sig  . buPROPion (WELLBUTRIN XL) 150 MG 24 hr tablet Take 1 tablet (150 mg total) by mouth every morning.  . traZODone  (DESYREL) 50 MG tablet Take 1 tablet (50 mg total) by mouth at bedtime. For sleep  . [DISCONTINUED] levofloxacin (LEVAQUIN) 500 MG tablet Take 1 tablet (500 mg total) by mouth daily. For infection  . [DISCONTINUED] traZODone (DESYREL) 50 MG tablet Take 1 tablet (50 mg total) by mouth at bedtime and may repeat dose one time if needed. For sleep    Recent Results (from the past 2160 hour(s))  ACETAMINOPHEN LEVEL     Status: None   Collection Time    04/05/14  4:00 PM      Result Value Ref Range   Acetaminophen (Tylenol), Serum <15.0  10 - 30 ug/mL   Comment:            THERAPEUTIC CONCENTRATIONS VARY     SIGNIFICANTLY. A RANGE OF 10-30     ug/mL MAY BE AN EFFECTIVE     CONCENTRATION FOR MANY PATIENTS.     HOWEVER, SOME ARE BEST TREATED     AT CONCENTRATIONS OUTSIDE THIS     RANGE.     ACETAMINOPHEN CONCENTRATIONS     >  150 ug/mL AT 4 HOURS AFTER     INGESTION AND >50 ug/mL AT 12     HOURS AFTER INGESTION ARE     OFTEN ASSOCIATED WITH TOXIC     REACTIONS.  CBC     Status: Abnormal   Collection Time    04/05/14  4:00 PM      Result Value Ref Range   WBC 20.3 (*) 4.0 - 10.5 K/uL   RBC 5.55  4.22 - 5.81 MIL/uL   Hemoglobin 15.9  13.0 - 17.0 g/dL   HCT 45.8  39.0 - 52.0 %   MCV 82.5  78.0 - 100.0 fL   MCH 28.6  26.0 - 34.0 pg   MCHC 34.7  30.0 - 36.0 g/dL   RDW 13.3  11.5 - 15.5 %   Platelets 365  150 - 400 K/uL  COMPREHENSIVE METABOLIC PANEL     Status: Abnormal   Collection Time    04/05/14  4:00 PM      Result Value Ref Range   Sodium 138  137 - 147 mEq/L   Potassium 4.4  3.7 - 5.3 mEq/L   Chloride 96  96 - 112 mEq/L   CO2 25  19 - 32 mEq/L   Glucose, Bld 98  70 - 99 mg/dL   BUN 15  6 - 23 mg/dL   Creatinine, Ser 0.87  0.50 - 1.35 mg/dL   Calcium 10.2  8.4 - 10.5 mg/dL   Total Protein 8.5 (*) 6.0 - 8.3 g/dL   Albumin 4.6  3.5 - 5.2 g/dL   AST 46 (*) 0 - 37 U/L   ALT 25  0 - 53 U/L   Alkaline Phosphatase 145 (*) 39 - 117 U/L   Total Bilirubin 1.0  0.3 - 1.2 mg/dL    GFR calc non Af Amer >90  >90 mL/min   GFR calc Af Amer >90  >90 mL/min   Comment: (NOTE)     The eGFR has been calculated using the CKD EPI equation.     This calculation has not been validated in all clinical situations.     eGFR's persistently <90 mL/min signify possible Chronic Kidney     Disease.  ETHANOL     Status: None   Collection Time    04/05/14  4:00 PM      Result Value Ref Range   Alcohol, Ethyl (B) <11  0 - 11 mg/dL   Comment:            LOWEST DETECTABLE LIMIT FOR     SERUM ALCOHOL IS 11 mg/dL     FOR MEDICAL PURPOSES ONLY  SALICYLATE LEVEL     Status: Abnormal   Collection Time    04/05/14  4:00 PM      Result Value Ref Range   Salicylate Lvl <8.1 (*) 2.8 - 20.0 mg/dL  CK     Status: Abnormal   Collection Time    04/05/14  4:00 PM      Result Value Ref Range   Total CK 4079 (*) 7 - 232 U/L  MAGNESIUM     Status: None   Collection Time    04/05/14  4:00 PM      Result Value Ref Range   Magnesium 1.8  1.5 - 2.5 mg/dL  URINE RAPID DRUG SCREEN (HOSP PERFORMED)     Status: Abnormal   Collection Time    04/05/14  4:23 PM      Result Value Ref Range  Opiates POSITIVE (*) NONE DETECTED   Cocaine NONE DETECTED  NONE DETECTED   Benzodiazepines POSITIVE (*) NONE DETECTED   Amphetamines NONE DETECTED  NONE DETECTED   Tetrahydrocannabinol POSITIVE (*) NONE DETECTED   Barbiturates NONE DETECTED  NONE DETECTED   Comment:            DRUG SCREEN FOR MEDICAL PURPOSES     ONLY.  IF CONFIRMATION IS NEEDED     FOR ANY PURPOSE, NOTIFY LAB     WITHIN 5 DAYS.                LOWEST DETECTABLE LIMITS     FOR URINE DRUG SCREEN     Drug Class       Cutoff (ng/mL)     Amphetamine      1000     Barbiturate      200     Benzodiazepine   941     Tricyclics       740     Opiates          300     Cocaine          300     THC              50  AMMONIA     Status: None   Collection Time    04/05/14  4:40 PM      Result Value Ref Range   Ammonia RESULTS UNAVAILABLE DUE TO  INTERFERING SUBSTANCE  11 - 60 umol/L  TSH     Status: None   Collection Time    04/05/14  4:45 PM      Result Value Ref Range   TSH 1.120  0.350 - 4.500 uIU/mL   Comment: Please note change in reference range.     Performed at [place] [place] MICROSCOPIC     Status: Abnormal   Collection Time    04/05/14  6:52 PM      Result Value Ref Range   Color, Urine YELLOW  YELLOW   APPearance CLOUDY (*) CLEAR   Specific Gravity, Urine 1.023  1.005 - 1.030   pH 6.0  5.0 - 8.0   Glucose, UA NEGATIVE  NEGATIVE mg/dL   Hgb urine dipstick NEGATIVE  NEGATIVE   Bilirubin Urine NEGATIVE  NEGATIVE   Ketones, ur 15 (*) NEGATIVE mg/dL   Protein, ur NEGATIVE  NEGATIVE mg/dL   Urobilinogen, UA 0.2  0.0 - 1.0 mg/dL   Nitrite NEGATIVE  NEGATIVE   Leukocytes, UA NEGATIVE  NEGATIVE   Comment: MICROSCOPIC NOT DONE ON URINES WITH NEGATIVE PROTEIN, BLOOD, LEUKOCYTES, NITRITE, OR GLUCOSE <1000 mg/dL.  URINE CULTURE     Status: None   Collection Time    04/05/14  6:52 PM      Result Value Ref Range   Specimen Description URINE, CLEAN CATCH     Special Requests NONE     Culture  Setup Time       Value: 04/05/2014 22:38     Performed at [facility] Count       Value: 2,000 COLONIES/ML     Performed at [facility]   Culture       Value: INSIGNIFICANT GROWTH     Performed at [facility]   Report Status 04/06/2014 FINAL    COMPREHENSIVE METABOLIC PANEL     Status: Abnormal   Collection Time    04/06/14  4:30  AM      Result Value Ref Range   Sodium 141  137 - 147 mEq/L   Potassium 3.9  3.7 - 5.3 mEq/L   Chloride 106  96 - 112 mEq/L   Comment: DELTA CHECK NOTED   CO2 24  19 - 32 mEq/L   Glucose, Bld 96  70 - 99 mg/dL   BUN 16  6 - 23 mg/dL   Creatinine, Ser 0.90  0.50 - 1.35 mg/dL   Calcium 9.1  8.4 - 10.5 mg/dL   Total Protein 6.5  6.0 - 8.3 g/dL   Albumin 3.4 (*) 3.5 - 5.2 g/dL   AST 51 (*) 0 - 37 U/L   ALT 20  0 - 53 U/L    Alkaline Phosphatase 106  39 - 117 U/L   Total Bilirubin 0.5  0.3 - 1.2 mg/dL   GFR calc non Af Amer >90  >90 mL/min   GFR calc Af Amer >90  >90 mL/min   Comment: (NOTE)     The eGFR has been calculated using the CKD EPI equation.     This calculation has not been validated in all clinical situations.     eGFR's persistently <90 mL/min signify possible Chronic Kidney     Disease.  CBC WITH DIFFERENTIAL     Status: Abnormal   Collection Time    04/06/14  4:30 AM      Result Value Ref Range   WBC 13.3 (*) 4.0 - 10.5 K/uL   RBC 4.34  4.22 - 5.81 MIL/uL   Hemoglobin 13.3  13.0 - 17.0 g/dL   HCT 36.6 (*) 39.0 - 52.0 %   MCV 84.3  78.0 - 100.0 fL   MCH 30.6  26.0 - 34.0 pg   MCHC 36.3 (*) 30.0 - 36.0 g/dL   RDW 13.6  11.5 - 15.5 %   Platelets 234  150 - 400 K/uL   Comment: DELTA CHECK NOTED     REPEATED TO VERIFY   Neutrophils Relative % 59  43 - 77 %   Neutro Abs 7.9 (*) 1.7 - 7.7 K/uL   Lymphocytes Relative 29  12 - 46 %   Lymphs Abs 3.9  0.7 - 4.0 K/uL   Monocytes Relative 10  3 - 12 %   Monocytes Absolute 1.4 (*) 0.1 - 1.0 K/uL   Eosinophils Relative 1  0 - 5 %   Eosinophils Absolute 0.1  0.0 - 0.7 K/uL   Basophils Relative 0  0 - 1 %   Basophils Absolute 0.0  0.0 - 0.1 K/uL  CK TOTAL AND CKMB     Status: Abnormal   Collection Time    04/06/14  4:30 AM      Result Value Ref Range   Total CK 3397 (*) 7 - 232 U/L   CK, MB 11.6 (*) 0.3 - 4.0 ng/mL   Comment: CRITICAL RESULT CALLED TO, READ BACK BY AND VERIFIED WITH:     W.WILLARD,RN 0981 04/06/14 M.CAMPBELL   Relative Index 0.3  0.0 - 2.5   Comment: Performed at [facility], ACUTE     Status: None   Collection Time    04/09/14  8:25 PM      Result Value Ref Range   Hepatitis B Surface Ag NEGATIVE  NEGATIVE   HCV Ab NEGATIVE  NEGATIVE   Hep A IgM NON REACTIVE  NON REACTIVE   Hep B C IgM NON REACTIVE  NON REACTIVE  Comment: (NOTE)     High levels of Hepatitis B Core IgM antibody are detectable      during the acute stage of Hepatitis B. This antibody is used     to differentiate current from past HBV infection.     Performed at [facility]      Physical Exam: Constitutional:  BP 129/79  Pulse 80  Ht _0  (1.88 m)  Wt 292 lb (132.45 kg)  BMI 37.47 kg/m2  Musculoskeletal: Strength & Muscle Tone: within normal limits Gait & Station: normal Patient leans: N/A  Mental Status Examination;  Patient is a young man who is casually dressed and fairly groomed.  His speech is slow but clear and coherent.  His thought process is also slow but logical and goal-directed.  He denies any auditory or visual hallucination.  Denies any active or passive suicidal thoughts or homicidal thoughts.  He described his mood as sad and depressed and his affect is constricted.  His attention and concentration is fair.  His psychomotor activity is slightly decreased.  His fund of knowledge is average.  There were no tremors or shakes.  There were no flight of ideas or any loose association.  He is alert and oriented x3. There were no paranoia, delusions or any obsessive thoughts.  His insight judgment and impulse control is okay.   Review of Psycho-Social Stressors (1), Review or order clinical lab tests (1), Discuss test with performing physician (1), Review and summation of old records (2), Established Problem, Worsening (2), New Problem, with no additional work-up planned (3), Review of Medication Regimen & Side Effects (2) and Review of New Medication or Change in Dosage (2)  Assessment: Axis I: Polysubstance dependence, major depressive disorder recurrent  Axis II: Deferred  Axis III:  Past Medical History  Diagnosis Date  . Polysubstance abuse 04/05/2014    Axis IV: Mild to moderate   Plan:  I review his symptoms, discharge summary, blood results and has psychosocial stressors.  Patient is to have symptoms of depression and anxiety.  He recently relapsed into cannabis use and  heroin.  However he wants to change his life.  He is willing to try medication and counseling.  He like to retry Wellbutrin because his family member has taken Wellbutrin with good response.  We will stop the Wellbutrin 150 mg daily to help depressive symptoms.  I had a long discussion with the patient about medication side effects and also to stop his drug use.  Discussed about interaction of drugs with psychotropic medication and slow recovery of his symptoms.  Encouraged to keep going to NA meetings.  I also scheduled to see that this in this office for counseling and coping skills.  Recommended to call us back if he is any question or any concern.  Recommended to take trazodone 50 mg as needed for insomnia.  I will see him again in 2-3 weeks.  Recommended to call us back if he has any question or any concern.Time spent 55 minutes.  More than 50% of the time spent in psychoeducation, counseling and coordination of care.  Discuss safety plan that anytime having active suicidal thoughts or homicidal thoughts then patient need to call 911 or go to the local emergency room.    Cinthia Rodden T., MD 04/30/2014

## 2014-05-14 ENCOUNTER — Ambulatory Visit (HOSPITAL_COMMUNITY): Payer: Self-pay | Admitting: Psychiatry

## 2014-05-30 ENCOUNTER — Ambulatory Visit (HOSPITAL_COMMUNITY): Payer: Self-pay | Admitting: Psychology

## 2014-07-20 ENCOUNTER — Encounter (HOSPITAL_COMMUNITY): Payer: Self-pay | Admitting: Emergency Medicine

## 2014-07-20 ENCOUNTER — Emergency Department (HOSPITAL_COMMUNITY)
Admission: EM | Admit: 2014-07-20 | Discharge: 2014-08-13 | Disposition: E | Payer: BC Managed Care – PPO | Attending: Emergency Medicine | Admitting: Emergency Medicine

## 2014-07-20 DIAGNOSIS — R231 Pallor: Secondary | ICD-10-CM | POA: Diagnosis not present

## 2014-07-20 DIAGNOSIS — Y9389 Activity, other specified: Secondary | ICD-10-CM | POA: Diagnosis not present

## 2014-07-20 DIAGNOSIS — Y9289 Other specified places as the place of occurrence of the external cause: Secondary | ICD-10-CM | POA: Insufficient documentation

## 2014-07-20 DIAGNOSIS — T50902A Poisoning by unspecified drugs, medicaments and biological substances, intentional self-harm, initial encounter: Secondary | ICD-10-CM

## 2014-07-20 DIAGNOSIS — G529 Cranial nerve disorder, unspecified: Secondary | ICD-10-CM | POA: Diagnosis not present

## 2014-07-20 DIAGNOSIS — T50991A Poisoning by other drugs, medicaments and biological substances, accidental (unintentional), initial encounter: Secondary | ICD-10-CM | POA: Insufficient documentation

## 2014-07-20 DIAGNOSIS — I469 Cardiac arrest, cause unspecified: Secondary | ICD-10-CM | POA: Diagnosis not present

## 2014-07-20 MED ORDER — NALOXONE HCL 1 MG/ML IJ SOLN
INTRAMUSCULAR | Status: AC | PRN
  Administered 2014-07-20: 4 mg via INTRAMUSCULAR

## 2014-07-20 NOTE — ED Provider Notes (Signed)
CSN: [id number]     Arrival date & time 07/13/2014  1301 History   First MD Initiated Contact with Patient 12/31/2013 1322     Chief Complaint  Patient presents with  . Drug Overdose     (Consider location/radiation/quality/duration/timing/severity/associated sxs/prior Treatment) HPI Comments: 18 year old male with drug abuse history presents with EMS after being found unresponsive and needles by his side. Patient last known normal and he spoke with his father around 3:30 in the morning and per report stated this was the end. No known medical problems and no other drugs found by EMS. Patient given multiple epinephrines and CPR with possible brief episode of V. tach for which a shock was given. Patient had no neurologic response on route. CPR was continued entire time.  Patient is a 18 y.o. male presenting with Overdose. The history is provided by the EMS personnel.  Drug Overdose    History reviewed. No pertinent past medical history. History reviewed. No pertinent past surgical history. History reviewed. No pertinent family history. History  Substance Use Topics  . Smoking status: Unknown If Ever Smoked  . Smokeless tobacco: Not on file  . Alcohol Use: Not on file    Review of Systems  Unable to perform ROS: Patient unresponsive      Allergies  Review of patient's allergies indicates no known allergies.  Home Medications   Prior to Admission medications   Not on File   There were no vitals taken for this visit. Physical Exam  Nursing note and vitals reviewed. Constitutional: He appears well-nourished.  HENT:  Head: Normocephalic and atraumatic.  King airway in place  Eyes: Right eye exhibits no discharge. Left eye exhibits no discharge.  Neck: Neck supple. No tracheal deviation present.  Cardiovascular:  No femoral or carotid pulse on multiple rechecks  Pulmonary/Chest: He has no rales.  Equal breath sounds bilateral, intubated  Abdominal: Soft. He exhibits no  distension.  Musculoskeletal: He exhibits no edema.  Neurological: A cranial nerve deficit is present. GCS eye subscore is 1. GCS verbal subscore is 1. GCS motor subscore is 1.  Pupils unresponsive and equal 3 mm, no spontaneous movement, no spontaneous respiration  Skin: Skin is warm. No rash noted. There is pallor.  Track marks on a.c.  Psychiatric: He has a normal mood and affect.    ED Course  Procedures (including critical care time)   EMERGENCY DEPARTMENT US CARDIAC EXAM "Study: Limited Ultrasound of the heart and pericardium"  INDICATIONS:Cardiac arrest Multiple views of the heart and pericardium were obtained in real-time with a multi-frequency probe.  PERFORMED IO:[id number]:Myself  IMAGES ARCHIVED?: Yes  FINDINGS: No pericardial effusion and No cardiac activity  LIMITATIONS:  Emergent procedure  VIEWS USED: Parasternal long axis and Parasternal short axis  INTERPRETATION: Cardiac activity absent and Pericardial effusioin absent    EMERGENCY DEPARTMENT US CARDIAC EXAM "Study: Limited Ultrasound of the heart and pericardium"  INDICATIONS:Cardiac arrest Multiple views of the heart and pericardium were obtained in real-time with a multi-frequency probe.  PERFORMED WU:[id number]:Myself  IMAGES ARCHIVED?: Yes  FINDINGS: No pericardial effusion and Tamponade physiology absent no cardiac activity  LIMITATIONS:  Emergent procedure  VIEWS USED: Parasternal long axis and Parasternal short axis  INTERPRETATION: Cardiac activity absent and Pericardial effusioin absent  Emergency Ultrasound Study:   Angiocath insertion Performed by: Enid SkeensZAVITZ, Dorothyann Mourer M  Consent: Verbal consent obtained. Risks and benefits: risks, benefits and alternatives were discussed Immediately prior to procedure the correct patient, procedure, equipment, support staff and site/side marked as needed.  Indication: difficult IV access Preparation: Patient was prepped and draped in the usual sterile fashion. Vein  Location: right ac vein was visualized during assessment for potential access sites and was found to be patent/ easily compressed with linear ultrasound.  The needle was visualized with real-time ultrasound and guided into the vein. Gauge: 20 g  Image saved and stored.  Normal blood return.  Patient tolerance: Patient tolerated the procedure well with no immediate complications.     Labs Review Labs Reviewed - No data to display  Imaging Review No results found.   EKG Interpretation None      MDM   Final diagnoses:  Drug overdose, intentional, initial encounter  Cardiac arrest    Labs Reviewed - No data to display  Patient presented unresponsive with ongoing CPR with likely overdose as the cause. Glu normal in the field.  Despite multiple epinephrines, 4 mg Narcan and D50 in the field no response per EMS. Patient had IO and difficult IV, ultrasound-guided IV at the bedside. 4 mg repeat Narcan given without response, CPR continued in the ER with multiple rechecks. Medications  naloxone (NARCAN) injection (4 mg Injection Given 19-Aug-2014 1306)    Cardiopulmonary Resuscitation (CPR) Procedure Note Directed/Performed by: Enid Skeens I personally directed ancillary staff and/or performed CPR in an effort to regain return of spontaneous circulation and to maintain cardiac, neuro and systemic perfusion.   Patient had no cardiac activity on multiple rechecks, bedside ultrasound showed no cardiac activity,  fa father brought into the bedside prior to calling the code. Time of death 1:11 PM   Discussed the case with medical examiner and plan for autopsy Monday morning. Updated the family.   Overdose, Death, Cardiac arrest  Enid Skeens, MD 2014-08-19 [phone]

## 2014-07-20 NOTE — Code Documentation (Signed)
CPR stopped, unable to palpate pulses, CPR continued. 

## 2014-07-20 NOTE — ED Notes (Signed)
Pt presents to department via [facility] for evaluation of possible heroin overdose, CPR in progress upon arrival to ED. Pt found by father laying in bed, used syringe beside of him. Dad states patient told him "he was done" last night. [facility] Initial rhythm of asystole, received x11 epinephine and 4mg  narcan. defib x2. Asystole upon arrival to ED.

## 2014-07-20 NOTE — Code Documentation (Signed)
CPR stopped, unable to palpate pulses, asystole on cardiac monitor. CPR continued.

## 2014-07-20 NOTE — ED Notes (Signed)
Pt is not a candidate for lung study, will be ME case. Spoke with Al Decantick Miller, pt to be transported to morgue.

## 2014-07-20 NOTE — Progress Notes (Signed)
End-tidal CO2 detector hooked up to monitor and ET tube. Good waveform on monitor during CPR, but no actual CO2 value showed on screen. MD aware.

## 2014-07-20 NOTE — Code Documentation (Signed)
CPR stopped, no cardiac activity noted on ultrasound. Time of death pronounced @ 13:11 by Dr. Jodi MourningZavitz.

## 2014-07-21 NOTE — Progress Notes (Signed)
Chaplain Note: Patient arrived after having experienced a drug overdose. Family was present in Consultation B while doctors continued CPR for at least an hour. Family was later asked back to [facility]. Doctor informed family that their son did not make it. Father and Grandmother present, and grieving. Father expressed feelings of guilt and sadness. Grandmother was tearful, but continued to comfort her son. Grandmother notified me of family (mother and sister) that were on the way from [place]. Mother and sister were escorted to Consultation B. After learning the news, mother was very distraught, presenting anger, sadness, denial, and shock. Mother, father, and sister were allowed back to see the patient. Mother was too unstable and shaky to walk. Nurse tech and RN assisted in getting a wheelchair for the mother. Mother spend quite a while with her son, asking him to come back, asking for it not to be real, and asking why this had happened. Chaplain and nurse tech were present with the mother for support. After escorting the family back to the consultation room, the family pastor and more family and friends arrived. Chaplain provided hospitality to the family and friends. Family seemed to be grieving, but more calm Family was very appreciative of the staff support and visit. Chaplain provided father and grandmother with necessary information for patient placement and autopsy protocol. Family provided updated contact information listed below:  Antonio Sullivan (Father) H: [phone] C: [phone] Antonio Sullivan (Grandmother) C: [phone]  Toni AmendAndria Williamson, Chaplain

## 2014-07-22 ENCOUNTER — Encounter (HOSPITAL_COMMUNITY): Payer: Self-pay | Admitting: Psychiatry

## 2014-08-13 DEATH — deceased

## 2015-11-12 IMAGING — CR DG CHEST 2V
2 series · 2 of 2 positions shown · non-contrast
Comparison: None.

CLINICAL DATA: Altered mental status.  Leukocytosis.

EXAM:
CHEST  2 VIEW

[w chest lat]
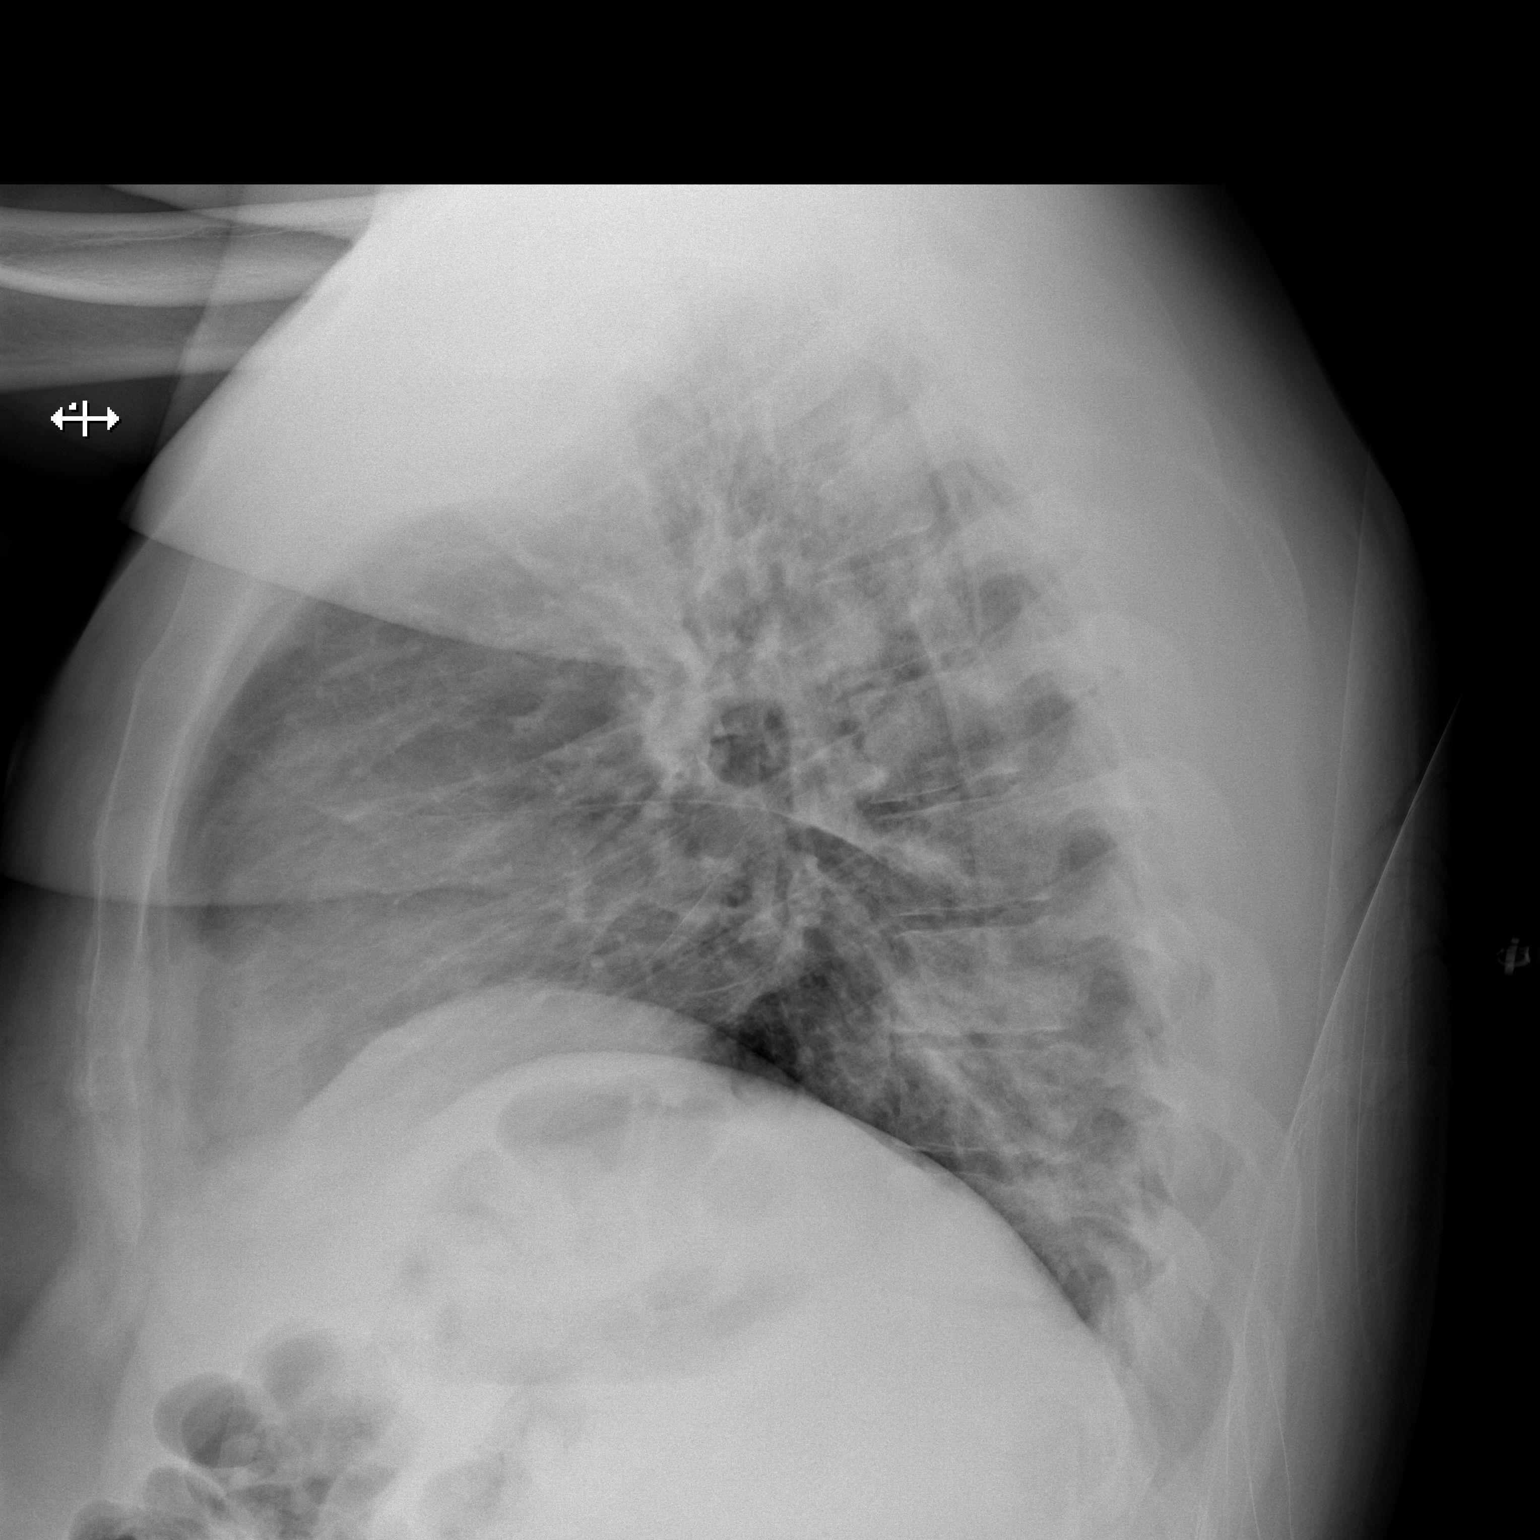

[x chest ap]
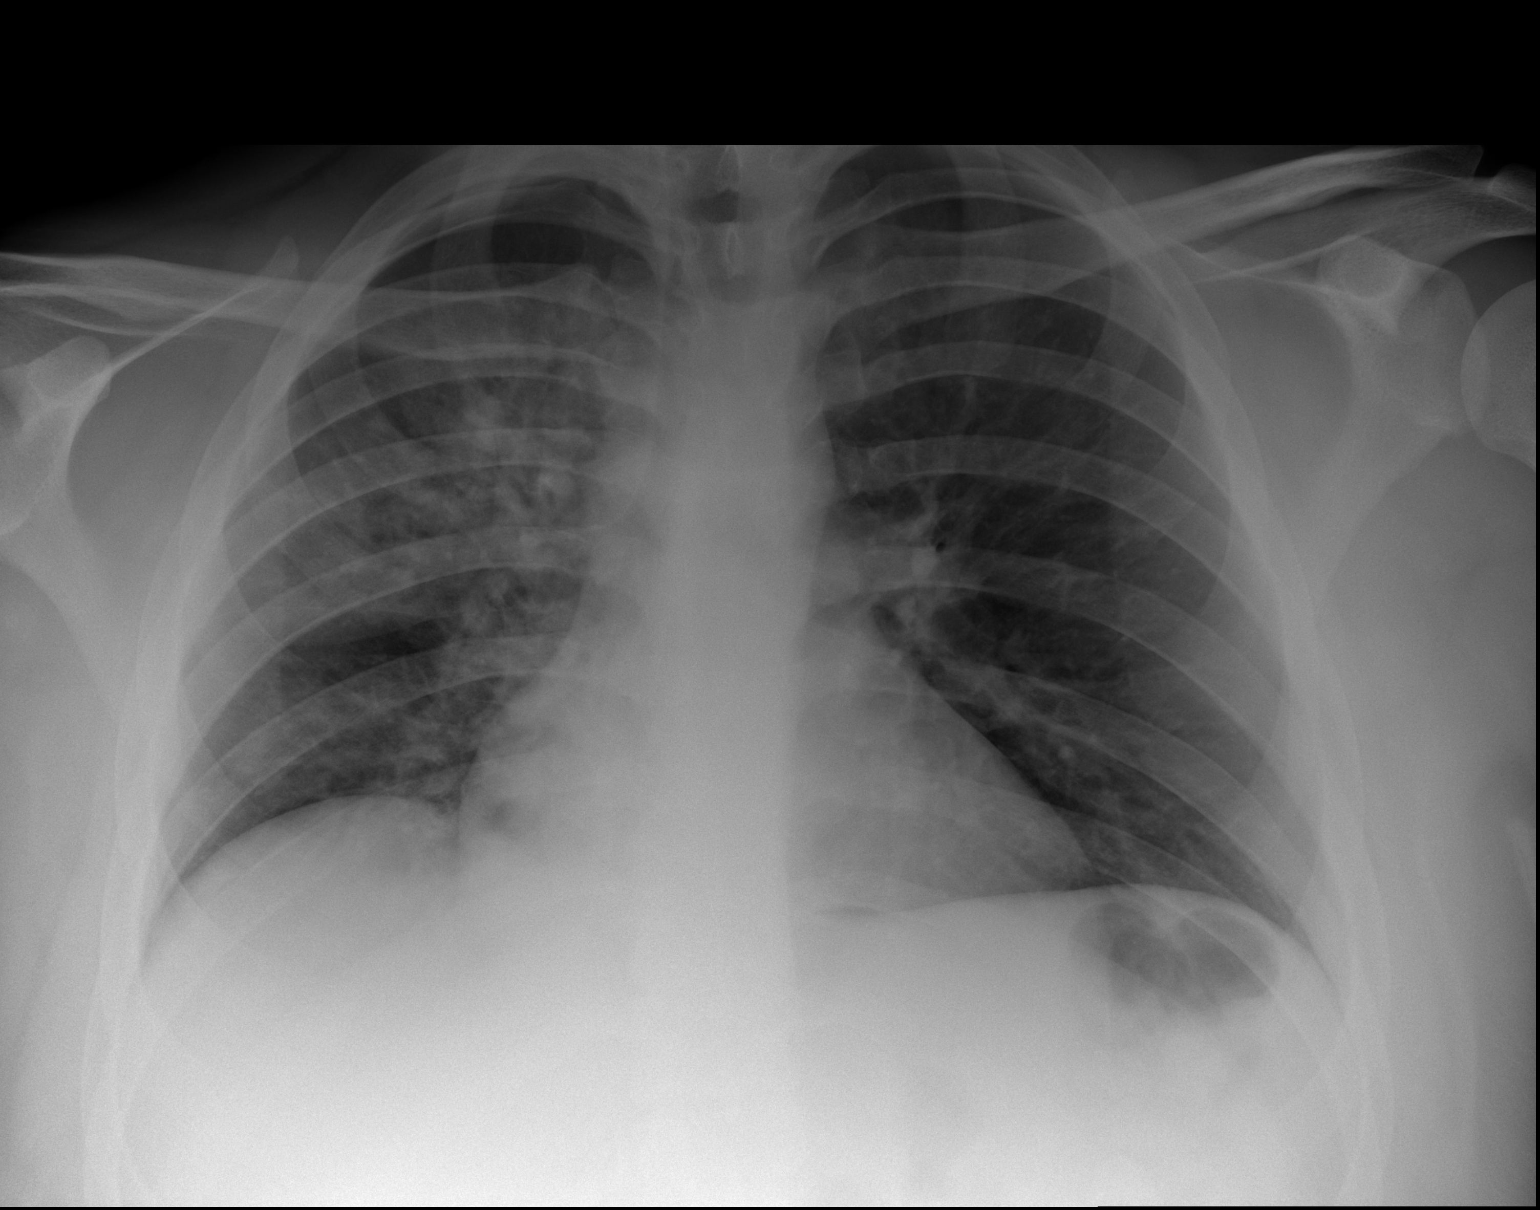

[2 of 2 positions shown; findings below may reference images not displayed]

FINDINGS: There is right upper and right lower lobe airspace disease most
consistent with pneumonia. The left lung is clear. Heart size is
normal. No pneumothorax or pleural effusion.
IMPRESSION: Right upper and lower lobe airspace disease most consistent with
pneumonia.
